# Patient Record
Sex: Male | Born: 1980 | Race: Black or African American | Hispanic: No | Marital: Single | Smoking: Never smoker
Health system: Southern US, Community
[De-identification: ages and names within clinical notes are randomized; demographics above are authoritative.]

---

## 2004-07-14 ENCOUNTER — Emergency Department (HOSPITAL_COMMUNITY): Admission: EM | Admit: 2004-07-14 | Discharge: 2004-07-14 | Payer: Self-pay | Admitting: *Deleted

## 2004-07-16 ENCOUNTER — Emergency Department (HOSPITAL_COMMUNITY): Admission: EM | Admit: 2004-07-16 | Discharge: 2004-07-16 | Payer: Self-pay | Admitting: Emergency Medicine

## 2007-04-15 ENCOUNTER — Emergency Department (HOSPITAL_COMMUNITY): Admission: EM | Admit: 2007-04-15 | Discharge: 2007-04-15 | Payer: Self-pay | Admitting: Emergency Medicine

## 2007-04-21 ENCOUNTER — Emergency Department (HOSPITAL_COMMUNITY): Admission: EM | Admit: 2007-04-21 | Discharge: 2007-04-21 | Payer: Self-pay | Admitting: Emergency Medicine

## 2007-12-27 ENCOUNTER — Emergency Department (HOSPITAL_COMMUNITY): Admission: EM | Admit: 2007-12-27 | Discharge: 2007-12-27 | Payer: Self-pay | Admitting: Family Medicine

## 2009-05-15 ENCOUNTER — Emergency Department (HOSPITAL_COMMUNITY): Admission: EM | Admit: 2009-05-15 | Discharge: 2009-05-15 | Payer: Self-pay | Admitting: Emergency Medicine

## 2009-08-04 ENCOUNTER — Emergency Department (HOSPITAL_COMMUNITY): Admission: EM | Admit: 2009-08-04 | Discharge: 2009-08-04 | Payer: Self-pay | Admitting: Family Medicine

## 2011-06-03 ENCOUNTER — Inpatient Hospital Stay (INDEPENDENT_AMBULATORY_CARE_PROVIDER_SITE_OTHER)
Admission: RE | Admit: 2011-06-03 | Discharge: 2011-06-03 | Disposition: A | Discharge status: Home / Self Care | Payer: BC Managed Care – PPO | Source: Ambulatory Visit | Attending: Family Medicine | Admitting: Family Medicine

## 2011-06-03 DIAGNOSIS — K5289 Other specified noninfective gastroenteritis and colitis: Secondary | ICD-10-CM

## 2011-06-03 DIAGNOSIS — J45909 Unspecified asthma, uncomplicated: Secondary | ICD-10-CM

## 2011-08-12 ENCOUNTER — Encounter (HOSPITAL_COMMUNITY): Payer: Self-pay | Admitting: Radiology

## 2011-08-12 ENCOUNTER — Emergency Department (HOSPITAL_COMMUNITY): Payer: BC Managed Care – PPO

## 2011-08-12 ENCOUNTER — Observation Stay (HOSPITAL_COMMUNITY)
Admission: EM | Admit: 2011-08-12 | Discharge: 2011-08-14 | DRG: 883 | Disposition: A | Discharge status: Home / Self Care | Payer: BC Managed Care – PPO | Attending: General Surgery | Admitting: General Surgery

## 2011-08-12 ENCOUNTER — Inpatient Hospital Stay (INDEPENDENT_AMBULATORY_CARE_PROVIDER_SITE_OTHER)
Admission: RE | Admit: 2011-08-12 | Discharge: 2011-08-12 | Disposition: A | Discharge status: Home / Self Care | Payer: BC Managed Care – PPO | Source: Ambulatory Visit | Attending: Family Medicine | Admitting: Family Medicine

## 2011-08-12 DIAGNOSIS — R1031 Right lower quadrant pain: Secondary | ICD-10-CM

## 2011-08-12 DIAGNOSIS — K3580 Unspecified acute appendicitis: Secondary | ICD-10-CM

## 2011-08-12 DIAGNOSIS — R109 Unspecified abdominal pain: Secondary | ICD-10-CM

## 2011-08-12 DIAGNOSIS — Z23 Encounter for immunization: Secondary | ICD-10-CM | POA: Insufficient documentation

## 2011-08-12 DIAGNOSIS — J45909 Unspecified asthma, uncomplicated: Secondary | ICD-10-CM | POA: Insufficient documentation

## 2011-08-12 HISTORY — PX: APPENDECTOMY: SHX54

## 2011-08-12 LAB — POCT I-STAT, CHEM 8
Creatinine, Ser: 1.2 mg/dL (ref 0.50–1.35)
Hemoglobin: 15.3 g/dL (ref 13.0–17.0)
Sodium: 139 mEq/L (ref 135–145)
TCO2: 27 mmol/L (ref 0–100)

## 2011-08-12 LAB — CBC
Hemoglobin: 12.8 g/dL — ABNORMAL LOW (ref 13.0–17.0)
MCHC: 34.4 g/dL (ref 30.0–36.0)
Platelets: 219 10*3/uL (ref 150–400)
RDW: 13.4 % (ref 11.5–15.5)

## 2011-08-12 LAB — COMPREHENSIVE METABOLIC PANEL
Albumin: 3.8 g/dL (ref 3.5–5.2)
BUN: 9 mg/dL (ref 6–23)
Creatinine, Ser: 0.97 mg/dL (ref 0.50–1.35)
GFR calc Af Amer: >60 mL/min (ref >60)
Total Protein: 7 g/dL (ref 6.0–8.3)

## 2011-08-12 LAB — DIFFERENTIAL
Basophils Absolute: 0 10*3/uL (ref 0.0–0.1)
Basophils Relative: 0 % (ref 0–1)
Eosinophils Absolute: 0 10*3/uL (ref 0.0–0.7)
Eosinophils Relative: 0 % (ref 0–5)
Monocytes Absolute: 1.2 10*3/uL — ABNORMAL HIGH (ref 0.1–1.0)
Neutro Abs: 8.9 10*3/uL — ABNORMAL HIGH (ref 1.7–7.7)

## 2011-08-12 LAB — URINALYSIS, ROUTINE W REFLEX MICROSCOPIC
Glucose, UA: NEGATIVE mg/dL
Leukocytes, UA: NEGATIVE
Nitrite: NEGATIVE
Protein, ur: NEGATIVE mg/dL
pH: 7.5 (ref 5.0–8.0)

## 2011-08-12 LAB — PROTIME-INR
INR: 1.02 (ref 0.00–1.49)
Prothrombin Time: 13.6 seconds (ref 11.6–15.2)

## 2011-08-12 MED ORDER — IOHEXOL 300 MG/ML  SOLN
100.0000 mL | Freq: Once | INTRAMUSCULAR | Status: AC | PRN
  Administered 2011-08-12: 100 mL via INTRAVENOUS

## 2011-08-12 NOTE — H&P (Signed)
  NAMEJAIDEN, Blake Holland NO.:  [id number]  MEDICAL RECORD NO.:  [id number]  LOCATION:  [facility]                         FACILITY:  [facility]  PHYSICIAN:  Juanetta Gosling, MDDATE OF BIRTH:  [birth year]  DATE OF ADMISSION:  08/12/2011 DATE OF DISCHARGE:                             HISTORY & PHYSICAL   CHIEF COMPLAINT:  Abdominal pain.  HISTORY OF PRESENT ILLNESS:  This is a 30 year old male who has about 2- 3-day history of lower abdominal pain that is worsened over that time and was not improved at all.  It was getting no relief and was aggravated by movement and palpation.  He has attempted multiple therapies including Maalox without any relief.  He had a subjective fever and had a couple of loose stools, but he has no nausea and vomiting associated with this.  He is anorectic.  He came in today just due to worsening of the pain.  PAST SURGICAL HISTORY:  Negative.  PAST MEDICAL HISTORY:  Reactive airway disease.  FAMILY HISTORY:  Noncontributory.  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  He drinks occasional alcohol. Does not smoke.  DRUG ALLERGIES:  IODINE/SHELLFISH.  MEDICATIONS:  Albuterol p.r.n. which he rarely uses.  REVIEW OF SYSTEMS:  He has had a dry cough for several months, but is otherwise negative.  PHYSICAL EXAMINATION:  VITAL SIGNS:  98.7, 83, 16, 140/88, 100%. GENERAL:  He is a well-appearing male, in no distress. HEENT:  No scleral icterus. NECK:  Supple without adenopathy. HEART:  Regular rate and rhythm. LUNGS:  Clear bilaterally. ABDOMEN:  Soft.  He has bowel sounds present.  He is tender in his right lower quadrant and suprapubic region.  LABS:  Show a potassium of 3.9, creatinine 0.97, glucose 70.  Liver function tests are within normal limits.  His white blood cell count is 12.7, hematocrit 37.2, platelets 219.  A CT scan is consistent with appendicitis.  ASSESSMENT:  Acute appendicitis.  PLAN:  I discussed laparoscopic  appendectomy with him with the risks being but not limited to bleeding, infection, open procedure, and postoperative abscess, and injury to other structures surrounding the appendix.  He understands.  He has been given some Zosyn.  We will plan on proceeding as soon as I can in the operating room.     Juanetta Gosling, MD     MCW/MEDQ  D:  08/12/2011  T:  08/12/2011  Job:  [id number]  Electronically Signed by Emelia Loron MD on 08/12/2011 09:20:35 PM

## 2011-08-14 NOTE — Op Note (Signed)
  NAMEDUSAN, LIPFORD NO.:  [id number]  MEDICAL RECORD NO.:  [id number]  LOCATION:  2550                         FACILITY:  [facility]  PHYSICIAN:  Juanetta Gosling, MDDATE OF BIRTH:  06/18/1981  DATE OF PROCEDURE: DATE OF DISCHARGE:                              OPERATIVE REPORT   PREOPERATIVE DIAGNOSIS:  Acute appendicitis.  POSTOPERATIVE DIAGNOSIS:  Acute suppurative appendicitis.  PROCEDURE:  Laparoscopic appendectomy.  SURGEON:  Troy Sine. Dwain Sarna, MD  ASSISTANT:  None.  ANESTHESIA:  General.  SUPERVISING ANESTHESIOLOGIST:  Maren Beach, MD  SPECIMENS:  Appendix to Pathology.  ESTIMATED BLOOD LOSS:  Minimal.  COMPLICATIONS:  None.  DRAINS:  None.  DISPOSITION:  To recovery room in stable condition.  INDICATIONS:  A 30 year old male with several days of lower abdominal pain mostly in his right lower quadrant with an elevated white count and a CT scan consistent with appendicitis.  I discussed appendectomy with him.  PROCEDURE IN DETAIL:  After informed consent was obtained, the patient was first given Zosyn in the emergency room.  He was then taken to the operating room.  He had sequential compression devices placed on lower extremities prior to induction with anesthesia.  He was then placed under general anesthesia without complication.  Foley catheter was placed.  He was then prepped and draped in standard sterile surgical fashion.  Surgical time-out was then performed.  We then infiltrated with 0.25% Marcaine below his umbilicus.  I made an incision with a 11 blade and carried this down to the level of fascia. I then incised this, I entered the peritoneum bluntly.  I placed a 0 Vicryl purse-string sutures of the fascia.  I introduced a Hasson trocar and insufflated the abdomen with 15 mmHg pressure.  I then placed a 5-mm trocar in the suprapubic region as well as the right upper quadrant under direct vision after infiltration of  local anesthetic without complication.  His appendix was noted be acutely suppurative and was almost necrotic in several places.  I was able to identify his base as well as his appendiceal mesentery.  I was able to encircle its base and I came across this with a GIA stapler.  I then divided the remainder of the mesentery with the harmonic scalpel.  There was hemostasis in the staple line and the cecum looked clean as well.  I placed the appendix in EndoCatch bag and removed this from the umbilicus.  I then irrigated and evacuated all of the irrigant.  I then desufflated the abdomen and removed all trocars.  I then closed down the umbilical incision with a purse-string suture and this completely obliterated the defect.  I then closed this with 4-0 Monocryl and Dermabond.  His Foley catheter was removed.  He was extubated and transferred to recovery room in stable condition.     Juanetta Gosling, MD     MCW/MEDQ  D:  08/12/2011  T:  08/12/2011  Job:  [id number]  Electronically Signed by Emelia Loron MD on 08/14/2011 02:07:01 PM

## 2011-08-15 ENCOUNTER — Other Ambulatory Visit (INDEPENDENT_AMBULATORY_CARE_PROVIDER_SITE_OTHER): Payer: Self-pay | Admitting: General Surgery

## 2011-08-26 LAB — POCT RAPID STREP A: Streptococcus, Group A Screen (Direct): NEGATIVE

## 2011-08-30 ENCOUNTER — Ambulatory Visit (INDEPENDENT_AMBULATORY_CARE_PROVIDER_SITE_OTHER): Payer: BC Managed Care – PPO | Admitting: Radiology

## 2011-08-30 ENCOUNTER — Encounter (INDEPENDENT_AMBULATORY_CARE_PROVIDER_SITE_OTHER): Payer: Self-pay | Admitting: Radiology

## 2011-08-30 VITALS — BP 116/68 | HR 60 | Temp 96.5°F | Resp 18 | Ht 76.0 in | Wt 244.0 lb

## 2011-08-30 DIAGNOSIS — K3580 Unspecified acute appendicitis: Secondary | ICD-10-CM

## 2011-08-30 NOTE — Progress Notes (Signed)
Blake Holland 1981-04-13 [id number] 08/30/2011   History of Present Illness: Blake Holland is a  30 y.o. male who presents today status post lap appy.  Pathology reveals Appendicitis, no cancer.  The patient is tolerating a regular diet, having normal bowel movements, has good pain control.  He  is back to most normal activities.   Physical Exam: Abd: soft, nontender, active bowel sounds, nondistended.  All incisions are well healed.  Impression: 1.  Acute appendicitis, s/p lap appy  Plan: He is able to return to normal activities. He may follow up on a prn basis.

## 2012-05-14 ENCOUNTER — Encounter (HOSPITAL_COMMUNITY): Payer: Self-pay

## 2012-05-14 ENCOUNTER — Emergency Department (HOSPITAL_COMMUNITY)
Admission: EM | Admit: 2012-05-14 | Discharge: 2012-05-14 | Disposition: A | Discharge status: Home / Self Care | Payer: Self-pay | Attending: Emergency Medicine | Admitting: Emergency Medicine

## 2012-05-14 ENCOUNTER — Emergency Department (HOSPITAL_COMMUNITY): Payer: Self-pay

## 2012-05-14 DIAGNOSIS — IMO0002 Reserved for concepts with insufficient information to code with codable children: Secondary | ICD-10-CM | POA: Insufficient documentation

## 2012-05-14 DIAGNOSIS — S61209A Unspecified open wound of unspecified finger without damage to nail, initial encounter: Secondary | ICD-10-CM | POA: Insufficient documentation

## 2012-05-14 DIAGNOSIS — S62639B Displaced fracture of distal phalanx of unspecified finger, initial encounter for open fracture: Secondary | ICD-10-CM | POA: Insufficient documentation

## 2012-05-14 MED ORDER — TETANUS-DIPHTH-ACELL PERTUSSIS 5-2.5-18.5 LF-MCG/0.5 IM SUSP
0.5000 mL | Freq: Once | INTRAMUSCULAR | Status: AC
  Administered 2012-05-14: 0.5 mL via INTRAMUSCULAR
  Filled 2012-05-14: qty 0.5

## 2012-05-14 MED ORDER — MORPHINE SULFATE 4 MG/ML IJ SOLN: 4.0000 mg | Freq: Once | INTRAMUSCULAR | Status: DC

## 2012-05-14 MED ORDER — CLINDAMYCIN HCL 300 MG PO CAPS: 300.0000 mg | ORAL_CAPSULE | Freq: Four times a day (QID) | ORAL | Status: AC

## 2012-05-14 MED ORDER — OXYCODONE-ACETAMINOPHEN 5-325 MG PO TABS
1.0000 | ORAL_TABLET | Freq: Once | ORAL | Status: AC
  Administered 2012-05-14: 1 via ORAL
  Filled 2012-05-14: qty 1

## 2012-05-14 MED ORDER — CLINDAMYCIN HCL 300 MG PO CAPS
300.0000 mg | ORAL_CAPSULE | Freq: Once | ORAL | Status: AC
  Administered 2012-05-14: 300 mg via ORAL
  Filled 2012-05-14: qty 1

## 2012-05-14 MED ORDER — PERCOCET 5-325 MG PO TABS: 1.0000 | ORAL_TABLET | ORAL | Status: AC | PRN

## 2012-05-14 MED ORDER — IBUPROFEN 800 MG PO TABS: 800.0000 mg | ORAL_TABLET | Freq: Three times a day (TID) | ORAL | Status: DC | PRN

## 2012-05-14 MED ORDER — IBUPROFEN 800 MG PO TABS
800.0000 mg | ORAL_TABLET | Freq: Once | ORAL | Status: AC
  Administered 2012-05-14: 800 mg via ORAL
  Filled 2012-05-14: qty 1

## 2012-05-14 NOTE — ED Provider Notes (Signed)
12:27 PM Patient in CDU holding.  Pt with open fracture of left index finger, laceration to palmar aspect.  Dr Karma Ganja has spoken with Dr Mina Marble who requests loose closure, f/u in his office tomorrow, d/c home on clindamycin.  I have discussed this plan with patient who verbalizes understanding and agrees with plan.  Would like more pain medication currently.    2:30 PM LACERATION REPAIR Performed by: Rise Patience Consent: Verbal consent obtained. Risks and benefits: risks, benefits and alternatives were discussed Patient identity confirmed: provided demographic data Time out performed prior to procedure Prepped and Draped in normal sterile fashion Wound explored  Laceration Location: left index finger, palmar aspect   Laceration Length: 4cm  No Foreign Bodies seen or palpated  Anesthesia: digital block local infiltration  Local anesthetic: lidocaine 2% no epinephrine  Anesthetic total: 6 ml  Irrigation method: syringe Amount of cleaning: standard  Skin closure: 5-0 prolene  Number of sutures or staples: 6  Technique: simple interrupted, loosely approximated.    Patient tolerance: Patient tolerated the procedure well with no immediate complications.   Patient with open fracture to left index finger.  Per Dr Ronie Spies request, I have thoroughly cleaned and closed the wound loosely.  Pt placed on clindamycin, first dose given in ED.  D/C home with follow up tomorrow with Dr Mina Marble in the office.  Rx for percocet and clindamycin.  Pt's wound dressed and placed in finger splint in ED.  Return precautions discussed and given in discharge instructions.  Patient verbalizes understanding and agrees with plan.    Dillard Cannon [place], [place] 05/14/12 [phone]

## 2012-05-14 NOTE — Progress Notes (Signed)
Orthopedic Tech Progress Note Patient Details:  Blake Holland 1981-05-22 [id number]  Ortho Devices Type of Ortho Device: Finger splint Ortho Device/Splint Location: (L) UE Ortho Device/Splint Interventions: Application   Jennye Moccasin 05/14/2012, 3:11 PM

## 2012-05-14 NOTE — Discharge Instructions (Signed)
Read the information below.  You have an open fracture of your finger.  It is very important that you take the antibiotics as directed until they are gone.  Take the pain medication as directed.  Do not take additional tylenol while taking this medication to avoid overdose.  You are to see Dr Mina Marble in his office tomorrow.  Please call his office today.  Return to the ER immediately if you develop uncontrolled pain, redness, swelling, difficulty bending or straightening your finger, pus draining from the wound, or fevers greater than 100.4.  You may return to the ER at any time for worsening condition or any new symptoms that concern you.  Finger Fracture Fractures of fingers are breaks in the bones of the fingers. There are many types of fractures. There are different ways of treating these fractures, all of which can be correct. Your caregiver will discuss the best way to treat your fracture. TREATMENT  Finger fractures can be treated with:   Non-reduction - this means the bones are in place. The finger is splinted without changing the positions of the bone pieces. The splint is usually left on for about a week to ten days. This will depend on your fracture and what your caregiver thinks.   Closed reduction - the bones are put back into position without using surgery. The finger is then splinted.   ORIF (open reduction and internal fixation) - the fracture site is opened. Then the bone pieces are fixed into place with pins or some type of hardware. This is seldom required. It depends on the severity of the fracture.  Your caregiver will discuss the type of fracture you have and the treatment that will be best for that problem. If surgery is the treatment of choice, the following is information for you to know and also let your caregiver know about prior to surgery. LET YOUR CAREGIVER KNOW ABOUT:  Allergies   Medications taken including herbs, eye drops, over the counter medications, and creams     Use of steroids (by mouth or creams)   Previous problems with anesthetics or Novocaine   Possibility of pregnancy, if this applies   History of blood clots (thrombophlebitis)   History of bleeding or blood problems   Previous surgery   Other health problems  AFTER THE PROCEDURE After surgery, you will be taken to the recovery area where a nurse will check your progress. Once you're awake, stable, and taking fluids well, barring other problems you will be allowed to go home. Once home an ice pack applied to your operative site may help with discomfort and keep the swelling down. HOME CARE INSTRUCTIONS   Follow your caregiver's instructions as to activities, exercises, physical therapy, and driving a car.   Use your finger and exercise as directed.   Only take over-the-counter or prescription medicines for pain, discomfort, or fever as directed by your caregiver. Do not take aspirin until your caregiver OK's it, as this can increase bleeding immediately following surgery.   Stop using ibuprofen if it upsets your stomach. Let your caregiver know about it.  SEEK MEDICAL CARE IF:  You have increased bleeding (more than a small spot) from the wound or from beneath your splint.   You develop redness, swelling, or increasing pain in the wound or from beneath your splint.   There is pus coming from the wound or from beneath your splint.   An unexplained oral temperature above 102 F (38.9 C) develops, or as your  caregiver suggests.   There is a foul smell coming from the wound or dressing or from beneath your splint.  SEEK IMMEDIATE MEDICAL CARE IF:   You develop a rash.   You have difficulty breathing.   You have any allergic problems.  MAKE SURE YOU:   Understand these instructions.   Will watch your condition.   Will get help right away if you are not doing well or get worse.  Document Released: 03/05/2001 Document Revised: 11/10/2011 Document Reviewed:  07/10/2008 [facility] Patient Information 2012 [place], [place].

## 2012-05-14 NOTE — ED Notes (Signed)
MD at bedside. 

## 2012-05-14 NOTE — ED Notes (Signed)
Patient transported to X-ray 

## 2012-05-14 NOTE — ED Notes (Signed)
Pt presented to the ER with laceration to lt index finger after his hand got caught with the front door.

## 2012-05-14 NOTE — ED Provider Notes (Signed)
History     CSN: [id number]  Arrival date & time 05/14/12  3086   First MD Initiated Contact with Patient 05/14/12 [phone]      Chief Complaint  Patient presents with  . Hand Injury    (Consider location/radiation/quality/duration/timing/severity/associated sxs/prior treatment) HPI Pt presents with c/o pain in left first finger after slamming it into a car door this morning.  Laceration to palmar surface of index finger.  He does not remember when he last had a tetanus shot.  No other injuries.  Bleeding controlled with direct pressure.  Movement and palpation makes pain worse.  There are no other alleviating or modifying factors, there are no associated systemic symptoms.   Past Medical History  Diagnosis Date  . Asthma   . Abdominal pain     around surgical area from appendectomy     Past Surgical History  Procedure Date  . Appendectomy 08/12/11    Family History  Problem Relation Age of Onset  . Cancer Mother 21    breast  . Heart disease Father 76    History  Substance Use Topics  . Smoking status: Never Smoker   . Smokeless tobacco: Never Used  . Alcohol Use: Yes      Review of Systems ROS reviewed and all otherwise negative except for mentioned in HPI  Allergies  Shellfish allergy  Home Medications   Current Outpatient Rx  Name Route Sig Dispense Refill  . FEXOFENADINE HCL 180 MG PO TABS Oral Take 180 mg by mouth daily.    Marland Kitchen CLINDAMYCIN HCL 300 MG PO CAPS Oral Take 1 capsule (300 mg total) by mouth 4 (four) times daily. 40 capsule 0  . IBUPROFEN 800 MG PO TABS Oral Take 1 tablet (800 mg total) by mouth every 8 (eight) hours as needed for pain. 21 tablet 0  . PERCOCET 5-325 MG PO TABS Oral Take 1-2 tablets by mouth every 4 (four) hours as needed for pain. 20 tablet 0    Dispense as written.    BP 141/83  Pulse 72  Temp(Src) 98.2 F (36.8 C) (Oral)  Resp 19  Ht 6\' 4"  (1.93 m)  Wt 250 lb (113.399 kg)  BMI 30.43 kg/m2  SpO2 100% Vitals  reviewed Physical Exam Physical Examination: General appearance - alert, well appearing, and in no distress Mental status - alert, oriented to person, place, and time Eyes - no conjunctival injection, no scleral icterus Chest - clear to auscultation, no wheezes, rales or rhonchi, symmetric air entry Heart - normal rate, regular rhythm, normal S1, S2, no murmurs, rubs, clicks or gallops Musculoskeletal - left first finger with laceration as described- difficulty flexing at DIP joint, full extension and flexion at other finger and hand joints, otherwise  no joint tenderness, deformity or swelling Extremities - peripheral pulses normal, no pedal edema, no clubbing or cyanosis Skin - normal coloration and turgor, no rashes, approx 3cm laceration to flexor surface of left first finger overlying DIP flexor crease, bleeding controlled  ED Course  Procedures (including critical care time)  10:24 AM discussed injury with Dr. Ronie Spies office, she will relay message to him as he is in surgery, pt NPO since last night  10:58 AM pt to be moved to CDU while awaiting hand consult.  D/w Trixie Dredge, PA  11:36 AM  Call back received from Dr. Mina Marble- he recommends loose closure and f/u with him tomorrow in his clinic.  Pt to be started on clindamycin as well.   Labs Reviewed -  No data to display Dg Finger Index Left  05/14/2012  *RADIOLOGY REPORT*  Clinical Data: Left finger injury with pain and laceration.  LEFT INDEX FINGER 2+V  Comparison: None.  Findings: There is a nondisplaced obliquely oriented fracture involving the distal phalanx.  The fracture line extends to the distal interphalangeal joint.  Overlying soft tissue swelling and laceration.  IMPRESSION: Nondisplaced distal phalangeal fracture.  Original Report Authenticated By: Reyes Ivan, M.D.     1. Open fracture of finger, distal phalanx       MDM  Pt presenting with c/o injury to left index finger after slamming it in a car door  this morning.  Xray shows fracture of distal phalanx- xray images reviewed by me as well.  Pt with minimal ability to flex at DIP joint, brisk cap refill distally.  D/w Dr. Mina Marble, hand surgery- he recommends loose closure- performed as noted by PA in CDU- pt started on clindamycin and instructed to f/u with Dr. Mina Marble tomorrow in his office.  Pt given strict return precuatinos, he is agreeable with this plan        Ethelda Chick, MD 05/16/12 1011

## 2012-05-16 ENCOUNTER — Ambulatory Visit: Payer: Self-pay | Attending: Orthopedic Surgery | Admitting: *Deleted

## 2012-05-16 DIAGNOSIS — M25549 Pain in joints of unspecified hand: Secondary | ICD-10-CM | POA: Insufficient documentation

## 2012-05-16 DIAGNOSIS — IMO0001 Reserved for inherently not codable concepts without codable children: Secondary | ICD-10-CM | POA: Insufficient documentation

## 2012-05-16 NOTE — ED Provider Notes (Signed)
Medical screening examination/treatment/procedure(s) were conducted as a shared visit with non-physician practitioner(s) and myself.  I personally evaluated the patient during the encounter  Ethelda Chick, MD 05/16/12 [phone]

## 2012-05-24 ENCOUNTER — Encounter (HOSPITAL_COMMUNITY): Payer: Self-pay | Admitting: Pharmacy Technician

## 2012-05-25 ENCOUNTER — Encounter (HOSPITAL_COMMUNITY): Payer: Self-pay | Admitting: *Deleted

## 2012-05-25 ENCOUNTER — Other Ambulatory Visit: Payer: Self-pay | Admitting: Orthopedic Surgery

## 2012-05-27 MED ORDER — CEFAZOLIN SODIUM-DEXTROSE 2-3 GM-% IV SOLR
2.0000 g | INTRAVENOUS | Status: AC
  Administered 2012-05-28: 2 g via INTRAVENOUS
  Filled 2012-05-27: qty 50

## 2012-05-27 MED ORDER — CHLORHEXIDINE GLUCONATE 4 % EX LIQD: 60.0000 mL | Freq: Once | CUTANEOUS | Status: DC

## 2012-05-28 ENCOUNTER — Encounter (HOSPITAL_COMMUNITY): Payer: Self-pay | Admitting: Emergency Medicine

## 2012-05-28 ENCOUNTER — Encounter (HOSPITAL_COMMUNITY)
Admission: RE | Disposition: A | Discharge status: Home / Self Care | Payer: Self-pay | Source: Ambulatory Visit | Attending: Orthopedic Surgery

## 2012-05-28 ENCOUNTER — Encounter (HOSPITAL_COMMUNITY): Payer: Self-pay | Admitting: *Deleted

## 2012-05-28 ENCOUNTER — Ambulatory Visit (HOSPITAL_COMMUNITY): Payer: Self-pay | Admitting: Anesthesiology

## 2012-05-28 ENCOUNTER — Emergency Department (HOSPITAL_COMMUNITY)
Admission: EM | Admit: 2012-05-28 | Discharge: 2012-05-29 | Disposition: A | Discharge status: Home / Self Care | Payer: Self-pay | Attending: Emergency Medicine | Admitting: Emergency Medicine

## 2012-05-28 ENCOUNTER — Encounter (HOSPITAL_COMMUNITY): Payer: Self-pay | Admitting: Anesthesiology

## 2012-05-28 ENCOUNTER — Ambulatory Visit (HOSPITAL_COMMUNITY)
Admission: RE | Admit: 2012-05-28 | Discharge: 2012-05-28 | Disposition: A | Discharge status: Home / Self Care | Payer: Self-pay | Source: Ambulatory Visit | Attending: Orthopedic Surgery | Admitting: Orthopedic Surgery

## 2012-05-28 DIAGNOSIS — S62609A Fracture of unspecified phalanx of unspecified finger, initial encounter for closed fracture: Secondary | ICD-10-CM

## 2012-05-28 DIAGNOSIS — Z803 Family history of malignant neoplasm of breast: Secondary | ICD-10-CM | POA: Insufficient documentation

## 2012-05-28 DIAGNOSIS — Z91013 Allergy to seafood: Secondary | ICD-10-CM | POA: Insufficient documentation

## 2012-05-28 DIAGNOSIS — S62639A Displaced fracture of distal phalanx of unspecified finger, initial encounter for closed fracture: Secondary | ICD-10-CM | POA: Insufficient documentation

## 2012-05-28 DIAGNOSIS — M79609 Pain in unspecified limb: Secondary | ICD-10-CM | POA: Insufficient documentation

## 2012-05-28 DIAGNOSIS — J45909 Unspecified asthma, uncomplicated: Secondary | ICD-10-CM | POA: Insufficient documentation

## 2012-05-28 DIAGNOSIS — G8918 Other acute postprocedural pain: Secondary | ICD-10-CM | POA: Insufficient documentation

## 2012-05-28 DIAGNOSIS — M79646 Pain in unspecified finger(s): Secondary | ICD-10-CM

## 2012-05-28 DIAGNOSIS — X58XXXA Exposure to other specified factors, initial encounter: Secondary | ICD-10-CM | POA: Insufficient documentation

## 2012-05-28 DIAGNOSIS — Z8249 Family history of ischemic heart disease and other diseases of the circulatory system: Secondary | ICD-10-CM | POA: Insufficient documentation

## 2012-05-28 HISTORY — PX: I&D EXTREMITY: SHX5045

## 2012-05-28 LAB — CBC
Platelets: 242 10*3/uL (ref 150–400)
RBC: 4.36 MIL/uL (ref 4.22–5.81)
RDW: 13.2 % (ref 11.5–15.5)
WBC: 4.9 10*3/uL (ref 4.0–10.5)

## 2012-05-28 LAB — SURGICAL PCR SCREEN: MRSA, PCR: NEGATIVE

## 2012-05-28 SURGERY — IRRIGATION AND DEBRIDEMENT EXTREMITY
Anesthesia: General | Site: Finger | Laterality: Left | Wound class: Clean

## 2012-05-28 MED ORDER — SODIUM CHLORIDE 0.9 % IR SOLN
Status: DC | PRN
  Administered 2012-05-28: 1

## 2012-05-28 MED ORDER — MIDAZOLAM HCL 5 MG/5ML IJ SOLN
INTRAMUSCULAR | Status: DC | PRN
  Administered 2012-05-28: 2 mg via INTRAVENOUS

## 2012-05-28 MED ORDER — FENTANYL CITRATE 0.05 MG/ML IJ SOLN
INTRAMUSCULAR | Status: DC | PRN
  Administered 2012-05-28: 100 ug via INTRAVENOUS
  Administered 2012-05-28: 50 ug via INTRAVENOUS

## 2012-05-28 MED ORDER — HYDROMORPHONE HCL PF 1 MG/ML IJ SOLN
0.2500 mg | INTRAMUSCULAR | Status: DC | PRN
  Administered 2012-05-28 (×4): 0.5 mg via INTRAVENOUS

## 2012-05-28 MED ORDER — ONDANSETRON HCL 4 MG/2ML IJ SOLN
INTRAMUSCULAR | Status: DC | PRN
  Administered 2012-05-28: 4 mg via INTRAVENOUS

## 2012-05-28 MED ORDER — BUPIVACAINE HCL (PF) 0.25 % IJ SOLN
INTRAMUSCULAR | Status: AC
  Filled 2012-05-28: qty 30

## 2012-05-28 MED ORDER — BUPIVACAINE HCL (PF) 0.25 % IJ SOLN
INTRAMUSCULAR | Status: DC | PRN
  Administered 2012-05-28: 4 mL

## 2012-05-28 MED ORDER — DROPERIDOL 2.5 MG/ML IJ SOLN: 0.6250 mg | INTRAMUSCULAR | Status: DC | PRN

## 2012-05-28 MED ORDER — MUPIROCIN 2 % EX OINT
TOPICAL_OINTMENT | CUTANEOUS | Status: AC
  Administered 2012-05-28: 1 via NASAL
  Filled 2012-05-28: qty 22

## 2012-05-28 MED ORDER — LIDOCAINE HCL (CARDIAC) 20 MG/ML IV SOLN
INTRAVENOUS | Status: DC | PRN
  Administered 2012-05-28: 50 mg via INTRAVENOUS
  Administered 2012-05-28: 100 mg via INTRAVENOUS

## 2012-05-28 MED ORDER — LACTATED RINGERS IV SOLN
INTRAVENOUS | Status: DC | PRN
  Administered 2012-05-28 (×2): via INTRAVENOUS

## 2012-05-28 MED ORDER — OXYCODONE-ACETAMINOPHEN 5-325 MG PO TABS: 1.0000 | ORAL_TABLET | ORAL | Status: DC | PRN

## 2012-05-28 MED ORDER — PROPOFOL 10 MG/ML IV EMUL
INTRAVENOUS | Status: DC | PRN
  Administered 2012-05-28: 200 mg via INTRAVENOUS

## 2012-05-28 MED ORDER — CEFAZOLIN SODIUM 1-5 GM-% IV SOLN
INTRAVENOUS | Status: AC
  Filled 2012-05-28: qty 100

## 2012-05-28 MED ORDER — HYDROMORPHONE HCL PF 1 MG/ML IJ SOLN
INTRAMUSCULAR | Status: AC
  Filled 2012-05-28: qty 1

## 2012-05-28 MED ORDER — LACTATED RINGERS IV SOLN
INTRAVENOUS | Status: DC
  Administered 2012-05-28: 15:00:00 via INTRAVENOUS

## 2012-05-28 SURGICAL SUPPLY — 55 items
BANDAGE CONFORM 2  STR LF (GAUZE/BANDAGES/DRESSINGS) IMPLANT
BANDAGE ELASTIC 3 VELCRO ST LF (GAUZE/BANDAGES/DRESSINGS) IMPLANT
BANDAGE ELASTIC 4 VELCRO ST LF (GAUZE/BANDAGES/DRESSINGS) IMPLANT
BANDAGE GAUZE ELAST BULKY 4 IN (GAUZE/BANDAGES/DRESSINGS) IMPLANT
BNDG CMPR 9X4 STRL LF SNTH (GAUZE/BANDAGES/DRESSINGS)
BNDG COHESIVE 1X5 TAN STRL LF (GAUZE/BANDAGES/DRESSINGS) ×2 IMPLANT
BNDG ESMARK 4X9 LF (GAUZE/BANDAGES/DRESSINGS) IMPLANT
CLOTH BEACON ORANGE TIMEOUT ST (SAFETY) ×2 IMPLANT
CORDS BIPOLAR (ELECTRODE) IMPLANT
COVER SURGICAL LIGHT HANDLE (MISCELLANEOUS) ×4 IMPLANT
CUFF TOURNIQUET SINGLE 18IN (TOURNIQUET CUFF) ×2 IMPLANT
DECANTER SPIKE VIAL GLASS SM (MISCELLANEOUS) IMPLANT
DRAIN PENROSE 1/4X12 LTX STRL (WOUND CARE) IMPLANT
DRAPE OEC MINIVIEW 54X84 (DRAPES) IMPLANT
DRAPE SURG 17X23 STRL (DRAPES) ×2 IMPLANT
DRSG PAD ABDOMINAL 8X10 ST (GAUZE/BANDAGES/DRESSINGS) IMPLANT
DURAPREP 26ML APPLICATOR (WOUND CARE) IMPLANT
ELECT REM PT RETURN 9FT ADLT (ELECTROSURGICAL)
ELECTRODE REM PT RTRN 9FT ADLT (ELECTROSURGICAL) IMPLANT
GAUZE PACKING IODOFORM 1/4X5 (PACKING) IMPLANT
GAUZE XEROFORM 1X8 LF (GAUZE/BANDAGES/DRESSINGS) ×2 IMPLANT
GLOVE BIO SURGEON STRL SZ8.5 (GLOVE) ×2 IMPLANT
GLOVE BIOGEL PI IND STRL 6.5 (GLOVE) ×1 IMPLANT
GLOVE BIOGEL PI INDICATOR 6.5 (GLOVE) ×1
GLOVE SURG SS PI 6.5 STRL IVOR (GLOVE) ×2 IMPLANT
GOWN SRG XL XLNG 56XLVL 4 (GOWN DISPOSABLE) ×1 IMPLANT
GOWN STRL NON-REIN LRG LVL3 (GOWN DISPOSABLE) ×2 IMPLANT
GOWN STRL NON-REIN XL XLG LVL4 (GOWN DISPOSABLE) ×2
HANDPIECE INTERPULSE COAX TIP (DISPOSABLE)
KIT BASIN OR (CUSTOM PROCEDURE TRAY) ×2 IMPLANT
KIT ROOM TURNOVER OR (KITS) ×2 IMPLANT
MANIFOLD NEPTUNE II (INSTRUMENTS) IMPLANT
MICRO-AIRE KWIRE ×2 IMPLANT
NEEDLE HYPO 25GX1X1/2 BEV (NEEDLE) IMPLANT
NEEDLE HYPO 25X1 1.5 SAFETY (NEEDLE) ×2 IMPLANT
NS IRRIG 1000ML POUR BTL (IV SOLUTION) ×2 IMPLANT
PACK ORTHO EXTREMITY (CUSTOM PROCEDURE TRAY) ×2 IMPLANT
PAD ARMBOARD 7.5X6 YLW CONV (MISCELLANEOUS) ×4 IMPLANT
PAD CAST 4YDX4 CTTN HI CHSV (CAST SUPPLIES) IMPLANT
PADDING CAST COTTON 4X4 STRL (CAST SUPPLIES)
SET HNDPC FAN SPRY TIP SCT (DISPOSABLE) IMPLANT
SPLINT FINGER (SOFTGOODS) ×2 IMPLANT
SPONGE GAUZE 4X4 12PLY (GAUZE/BANDAGES/DRESSINGS) ×2 IMPLANT
SPONGE LAP 18X18 X RAY DECT (DISPOSABLE) IMPLANT
SUCTION FRAZIER TIP 10 FR DISP (SUCTIONS) IMPLANT
SUT VICRYL RAPIDE 4/0 PS 2 (SUTURE) ×2 IMPLANT
SYR 20CC LL (SYRINGE) IMPLANT
SYR CONTROL 10ML LL (SYRINGE) ×4 IMPLANT
TOWEL OR 17X24 6PK STRL BLUE (TOWEL DISPOSABLE) ×2 IMPLANT
TOWEL OR 17X26 10 PK STRL BLUE (TOWEL DISPOSABLE) ×2 IMPLANT
TUBE ANAEROBIC SPECIMEN COL (MISCELLANEOUS) IMPLANT
TUBE CONNECTING 12X1/4 (SUCTIONS) ×2 IMPLANT
UNDERPAD 30X30 INCONTINENT (UNDERPADS AND DIAPERS) ×2 IMPLANT
WATER STERILE IRR 1000ML POUR (IV SOLUTION) IMPLANT
YANKAUER SUCT BULB TIP NO VENT (SUCTIONS) ×2 IMPLANT

## 2012-05-28 NOTE — Anesthesia Preprocedure Evaluation (Signed)
Anesthesia Evaluation  Patient identified by MRN, date of birth, ID band Patient awake    Reviewed: Allergy & Precautions, H&P , NPO status , Patient's Chart, lab work & pertinent test results, reviewed documented beta blocker date and time   Airway Mallampati: II TM Distance: >3 FB Neck ROM: full    Dental  (+) Teeth Intact and Dental Advidsory Given   Pulmonary asthma ,  breath sounds clear to auscultation  Pulmonary exam normal       Cardiovascular negative cardio ROS  Rhythm:regular Rate:Normal     Neuro/Psych    GI/Hepatic negative GI ROS, Neg liver ROS,   Endo/Other    Renal/GU      Musculoskeletal   Abdominal Normal abdominal exam  (+)   Peds  Hematology negative hematology ROS (+)   Anesthesia Other Findings   Reproductive/Obstetrics negative OB ROS                           Anesthesia Physical Anesthesia Plan  ASA: II  Anesthesia Plan: General LMA   Post-op Pain Management:    Induction:   Airway Management Planned:   Additional Equipment:   Intra-op Plan:   Post-operative Plan:   Informed Consent: I have reviewed the patients History and Physical, chart, labs and discussed the procedure including the risks, benefits and alternatives for the proposed anesthesia with the patient or authorized representative who has indicated his/her understanding and acceptance.   Dental Advisory Given  Plan Discussed with: Anesthesiologist, CRNA and Surgeon  Anesthesia Plan Comments:         Anesthesia Quick Evaluation

## 2012-05-28 NOTE — Preoperative (Signed)
Beta Blockers   Reason not to administer Beta Blockers:Not Applicable 

## 2012-05-28 NOTE — Op Note (Signed)
See note [id number]

## 2012-05-28 NOTE — H&P (Signed)
Blake Holland is an 31 y.o. male.   Chief Complaint: left index fracture and laceration HPI: as above with decreases flexion and sensation distal to laceration  Past Medical History  Diagnosis Date  . Asthma     Past Surgical History  Procedure Date  . Appendectomy 08/12/11    Family History  Problem Relation Age of Onset  . Cancer Mother 70    breast  . Heart disease Father 34   Social History:  reports that he has never smoked. He has never used smokeless tobacco. He reports that he drinks alcohol. He reports that he does not use illicit drugs.  Allergies:  Allergies  Allergen Reactions  . Shellfish Allergy Hives and Shortness Of Breath  . Iodine Hives    Medications Prior to Admission  Medication Sig Dispense Refill  . albuterol (PROVENTIL HFA;VENTOLIN HFA) 108 (90 BASE) MCG/ACT inhaler Inhale 2 puffs into the lungs every 6 (six) hours as needed.      . clindamycin (CLEOCIN) 300 MG capsule Take 300 mg by mouth 4 (four) times daily.      . fexofenadine (ALLEGRA) 180 MG tablet Take 180 mg by mouth daily.        No results found for this or any previous visit (from the past 48 hour(s)). No results found.  Review of Systems  All other systems reviewed and are negative.    Blood pressure 135/78, pulse 76, temperature 98.5 F (36.9 C), temperature source Oral, resp. rate 20, SpO2 99.00%. Physical Exam  Constitutional: He is oriented to person, place, and time. He appears well-developed and well-nourished.  HENT:  Head: Normocephalic and atraumatic.  Cardiovascular: Normal rate.   Respiratory: Effort normal.  Musculoskeletal:       Left hand: He exhibits decreased range of motion, disruption of two-point discrimination and laceration.  Neurological: He is alert and oriented to person, place, and time.  Skin: Skin is warm.  Psychiatric: He has a normal mood and affect. His speech is normal and behavior is normal. Thought content normal.     Assessment/Plan As  above  Plan explore and repair as needed Blake Holland A 05/28/2012, 1:56 PM

## 2012-05-28 NOTE — Anesthesia Postprocedure Evaluation (Signed)
Anesthesia Post Note  Patient: Blake Holland  Procedure(s) Performed: Procedure(s) (LRB): IRRIGATION AND DEBRIDEMENT EXTREMITY (Left)  Anesthesia type: general  Patient location: PACU  Post pain: Pain level controlled  Post assessment: Patient's Cardiovascular Status Stable  Last Vitals:  Filed Vitals:   05/28/12 1545  BP: 145/108  Pulse: 75  Temp: 35.8 C  Resp: 14    Post vital signs: Reviewed and stable  Level of consciousness: sedated  Complications: No apparent anesthesia complications

## 2012-05-28 NOTE — ED Notes (Signed)
Pt reports having hand surgery today for fx bone and tendon repair by Dr Earl Gala today. Pt reports pain medication percocets ineffective relief of pain. Pt did not call doctor for additional instructions or medication but came to ED

## 2012-05-28 NOTE — Brief Op Note (Signed)
05/28/2012  3:42 PM  PATIENT:  Blake Holland  31 y.o. male  PRE-OPERATIVE DIAGNOSIS:  left index laceration   POST-OPERATIVE DIAGNOSIS:  Left index finger laceration and fracture  PROCEDURE:  Procedure(s) (LRB): IRRIGATION AND DEBRIDEMENT EXTREMITY (Left)  SURGEON:  Surgeon(s) and Role:    * Marlowe Shores, MD - Primary  PHYSICIAN ASSISTANT:   ASSISTANTS: none   ANESTHESIA:   general  EBL:  Total I/O In: 1000 [I.V.:1000] Out: -   BLOOD ADMINISTERED:none  DRAINS: none   LOCAL MEDICATIONS USED:  MARCAINE   4cc  SPECIMEN:  No Specimen  DISPOSITION OF SPECIMEN:  N/A  COUNTS:  YES  TOURNIQUET:   Total Tourniquet Time Documented: Upper Arm (Left) - 33 minutes  DICTATION: .Other Dictation: Dictation Number [id number]  PLAN OF CARE: Discharge to home after PACU  PATIENT DISPOSITION:  PACU - hemodynamically stable.   Delay start of Pharmacological VTE agent (>24hrs) due to surgical blood loss or risk of bleeding: not applicable

## 2012-05-28 NOTE — Transfer of Care (Signed)
Immediate Anesthesia Transfer of Care Note  Patient: Blake Holland  Procedure(s) Performed: Procedure(s) (LRB): IRRIGATION AND DEBRIDEMENT EXTREMITY (Left)  Patient Location: PACU  Anesthesia Type: General  Level of Consciousness: awake, alert , oriented and sedated  Airway & Oxygen Therapy: Patient Spontanous Breathing and Patient connected to nasal cannula oxygen  Post-op Assessment: Report given to PACU RN, Post -op Vital signs reviewed and stable and Patient moving all extremities  Post vital signs: Reviewed and stable  Complications: No apparent anesthesia complications

## 2012-05-29 NOTE — ED Notes (Signed)
PT had surgery yesterday to repair damaged tendons.  He has taken 3 percocets at once with no relief.  Radial/ulnar pulses present.  No edema beyond normal post-surgical edema.

## 2012-05-29 NOTE — ED Provider Notes (Signed)
History     CSN: [id number]  Arrival date & time 05/28/12  2054   First MD Initiated Contact with Patient 05/28/12 2343      Chief Complaint  Patient presents with  . Hand Pain    post-op   HPI  History provided by the patient. Patient is a 31 year old male with history of asthma and recent left finger surgery procedure who presents with complaints of finger pain. Patient was seen earlier today by Dr. Earl Gala have tendon and finger repair. Patient was sent home and has been taking Percocet for his pain but states it is not improved and complaints of uncontrolled throbbing pain to the finger. He denies having any bleeding through his bandage. He denies any numbness or tingling. Patient denies any new injury. Patient denies any fever, chills, sweats, nausea vomiting.    Past Medical History  Diagnosis Date  . Asthma     Past Surgical History  Procedure Date  . Appendectomy 08/12/11    Family History  Problem Relation Age of Onset  . Cancer Mother 36    breast  . Heart disease Father 55    History  Substance Use Topics  . Smoking status: Never Smoker   . Smokeless tobacco: Never Used  . Alcohol Use: Yes      Review of Systems  Constitutional: Negative for fever and chills.  Musculoskeletal:       Finger pain  Neurological: Negative for weakness and numbness.    Allergies  Shellfish allergy and Iodine  Home Medications   Current Outpatient Rx  Name Route Sig Dispense Refill  . ALBUTEROL SULFATE HFA 108 (90 BASE) MCG/ACT IN AERS Inhalation Inhale 2 puffs into the lungs every 6 (six) hours as needed.    Marland Kitchen CLINDAMYCIN HCL 300 MG PO CAPS Oral Take 300 mg by mouth 4 (four) times daily.    Marland Kitchen FEXOFENADINE HCL 180 MG PO TABS Oral Take 180 mg by mouth daily.    . OXYCODONE-ACETAMINOPHEN 5-325 MG PO TABS Oral Take 1 tablet by mouth every 4 (four) hours as needed. For pain      BP 120/84  Pulse 55  Temp 97.2 F (36.2 C) (Oral)  Resp 19  SpO2 98%  Physical Exam    Nursing note and vitals reviewed. Constitutional: He is oriented to person, place, and time. He appears well-developed and well-nourished. No distress.  HENT:  Head: Normocephalic.  Cardiovascular: Normal rate and regular rhythm.   Pulmonary/Chest: Effort normal and breath sounds normal.  Musculoskeletal:       No bleeding or drainage through the dressing. Surgical pins in place to the distal finger without bleeding. No significant swelling to the finger. No other concerning signs post surgery.  Neurological: He is alert and oriented to person, place, and time.  Skin: Skin is warm.  Psychiatric: He has a normal mood and affect.    ED Course  Procedures     1. Postoperative pain   2. Finger pain       MDM  Patient seen and evaluated. Patient no acute distress.   Digital block performed to left index finger. Pain improved greatly. Patient afebrile. No concerning findings on exam post procedure. Patient was home pain medications and advised to use rest, ice, elevation to help with symptoms.       Angus Seller, [place] 05/30/12 [phone]

## 2012-05-29 NOTE — ED Notes (Signed)
Pt denies pain at this time.  Lidocaine improved pain greatly.

## 2012-05-29 NOTE — Op Note (Signed)
NAMETREYLEN, GIBBS NO.:  [id number]  MEDICAL RECORD NO.:  [id number]  LOCATION:                                 FACILITY:  PHYSICIAN:  Artist Pais. Rohen Kimes, M.D.DATE OF BIRTH:  Mar 31, 1981  DATE OF PROCEDURE:  05/28/2012 DATE OF DISCHARGE:                              OPERATIVE REPORT   PREOPERATIVE DIAGNOSIS:  Displaced intra-articular fracture, distal phalanx, left index finger.  POSTOPERATIVE DIAGNOSIS:  Displaced intra-articular fracture, distal phalanx, left index finger.  PROCEDURE:  Incision and drainage above with open reduction internal fixation, use 2028 K-wires as well as inspection of flexor sheath, neurovascular bundles left index finger.  SURGEON:  Artist Pais. Mina Marble, M.D.  ASSISTANT:  None.  ANESTHESIA:  General.  TOURNIQUET TIME:  36 minutes.  COMPLICATIONS:  No complications.  DRAINS:  No drains.  DESCRIPTION OF PROCEDURE:  The patient was taken to the operating suite. After induction of adequate general anesthesia, left upper extremity was prepped and draped in usual sterile fashion.  An Esmarch was used to exsanguinate the limb.  Tourniquet was inflated to 250 mmHg.  At this point in time, previously placed sutures in the emergency room were removed.  The volar based flap skin was elevated and dissection was carried down to the flexor sheath.  The ulnar neurovascular bundle was intact.  Radial neurovascular bundle was intact.  The flexor insertion was intact.  There was a fracture of the distal phalanx, intra-articular at the base.  The wound was thoroughly irrigated, debrided and then loosely closed with 4-0 Vicryl Rapide.  Fluoroscopy was used to fire 028 K-wires from radial to ulnar across the fracture site parallel and perpendicular to the fracture site.  They were cut outside the skin and bent upon themselves.  The patient then had a sterile dressing of Xeroform, 4x4s, fluffs, and a volar splint applied.  The  patient tolerated the procedure well and went to the recovery room in a stable fashion.    Artist Pais Mina Marble, M.D.    MAW/MEDQ  D:  05/28/2012  T:  05/28/2012  Job:  [id number]

## 2012-05-29 NOTE — Discharge Instructions (Signed)
Continue your pain medications as prescribed. Use rest, ice, compression and elevation to help reduce pain and swelling. Please followup with Dr. Mina Marble tomorrow for continued evaluation and treatment.    RESOURCE GUIDE  Chronic Pain Problems: Contact [facility]  [phone] Patients need to be referred by their primary care doctor.  Insufficient Money for Medicine: Contact United Way:  call "211" or [facility] [phone].  No Primary Care Doctor: - Call [facility]  [phone] - can help you locate a primary care doctor that  accepts your insurance, provides certain services, etc. - Physician Referral Service- [phone]  Agencies that provide inexpensive medical care: - [facility]  [phone] - [facility]  [phone] - [facility]  [phone] - Women's Clinic  [phone] - Planned Parenthood  [phone] [facility]  [phone]  Medicaid-accepting [facility] Providers: - [facility]- [address]  [phone], Mon-Fri 9am-7pm, Sat 9am-1pm - [facility]- [address] [place], Suite [place]  [phone] - [facility]- [address], [address] [place]  [phone] [facility]- [address]  [phone] - Renaye Rakers- [address] [place], [address], [phone]  Only accepts [place] [facility] [place] patients after they have their name  applied to their card  Self Pay (no insurance) in [place]: - Sickle Cell Patients: Dr Willey Blade, [facility]  [address] [place], [phone] - [facility]- [address] [place]  [phone]       [facility]- [address]       -     [facility]- see information above (Speak to [employer] if you do not have insurance)       -  [facility]- [address] [place], [phone]       -  [facility]- [address],  [phone]       -  [facility]- [address], [phone]       -  Dr Julio Sicks-  [address], [place], [phone]       -  [facility]- [address], [phone]       -  [facility]- [address] [phone], also [address] [phone]       -    [facility]- [address] [place], [phone], 1st & 3rd Saturday   every month, 10am-1pm  1) Find a Doctor and Pay Out of Pocket Although you won't have to find out who is covered by your insurance plan, it is a good idea to ask around and get recommendations. You will then need to call the office and see if the doctor you have chosen will accept you as a new patient and what types of options they offer for patients who are self-pay. Some doctors offer discounts or will set up payment plans for their patients who do not have insurance, but you will need to ask so you aren't surprised when you get to your appointment.  2) Contact Your Local Health Department Not all health departments have doctors that can see patients for sick visits, but many do, so it is worth a call to see if yours does. If you don't know where  your local health department is, you can check in your phone book. The CDC also has a tool to help you locate your state's health department, and many state websites also have listings of all of their local health departments.  3) Find a Walk-in Clinic If your illness is not likely to be very severe or complicated, you may want to try a walk in clinic. These are popping up all over the country in pharmacies, drugstores, and shopping centers. They're usually staffed by nurse practitioners or physician assistants that have been trained to treat common illnesses and complaints. They're usually fairly quick and inexpensive. However, if you have serious medical issues or chronic medical problems, these are probably not your best option  STD  Testing - [facility] Department of [facility], STD Clinic, [address], [place], phone [phone] or [phone].  Monday - Friday, call for an appointment. [facility] Department of [employer], STD Clinic, [place] [address], [place], phone [phone] or [phone].  Monday - Friday, call for an appointment.  Abuse/Neglect: [facility] Child Abuse Hotline (217)728-2969 [facility] Child Abuse Hotline 534-126-5871 (After Hours)  Emergency Shelter:  [facility] 731-169-6240  Maternity Homes: - [facility] [phone] - [facility] [phone]  MRSA Hotline #:   952 094 8310  [facility] Resources  [facility]  United Way [facility] Dept. [address]                 [address]         [address] [place] [address]  Blondell Reveal Phone:  [phone]                                  Phone:  [phone]                   Phone:  [phone]  [facility] Mental Health, [phone] - [phone]       -     [facility] in [place], [address]                                  [phone], [facility] Child Abuse Hotline 864-533-3222 or 647-104-7274 (After Hours)   Behavioral Health Services  Substance Abuse Resources: - Alcohol and Drug Services  218-790-9536 - [facility] [phone] - [facility] [place] [phone] Floydene Flock [phone] - Residential & Outpatient Substance Abuse Program  9707928459  Psychological Services: [facility]  ([phone] Services  [phone] - [facility], [phone] [place]. [address], [place], ACCESS LINE: [phone] or ([phone],  [employer]an.[employer].za  Dental Assistance  If unable to pay or uninsured, contact:  [facility] or [facility]  Health Dept. to become qualified for the adult dental clinic.  Patients with Medicaid: [facility] [phone] [address], [phone], [phone]  If unable to pay, or uninsured, contact [facility] (719)856-8715) or [facility] Department [phone] in [place], [phone] in [facility]) to become qualified for the adult dental clinic  Other Low-Cost Community Dental Services: - [facility]- [address] [place], [place], [place], [address], [phone], Ext. [phone], 2nd and 4th Thursday of the month at 6:30am.  10 clients each day by appointment, can sometimes see walk-in patients if someone does not show for an appointment. [facility]- [address] [place], [place], [address], [phone] - [facility]- [address] [place], [place], [address], [phone] - [facility]- [phone] [facility] Health Department- [phone] [facility] Department- [phone]

## 2012-05-30 NOTE — ED Provider Notes (Signed)
Medical screening examination/treatment/procedure(s) were performed by non-physician practitioner and as supervising physician I was immediately available for consultation/collaboration.  Louellen Haldeman M Harjas Biggins, MD 05/30/12 0712 

## 2012-05-31 ENCOUNTER — Ambulatory Visit: Payer: Self-pay | Admitting: Occupational Therapy

## 2012-05-31 ENCOUNTER — Encounter (HOSPITAL_COMMUNITY): Payer: Self-pay | Admitting: Orthopedic Surgery

## 2018-02-26 ENCOUNTER — Encounter (HOSPITAL_COMMUNITY): Payer: Self-pay | Admitting: Emergency Medicine

## 2018-02-26 ENCOUNTER — Other Ambulatory Visit: Payer: Self-pay

## 2018-02-26 ENCOUNTER — Emergency Department (HOSPITAL_COMMUNITY)
Admission: EM | Admit: 2018-02-26 | Discharge: 2018-02-26 | Disposition: A | Discharge status: Home / Self Care | Payer: Managed Care, Other (non HMO) | Attending: Emergency Medicine | Admitting: Emergency Medicine

## 2018-02-26 DIAGNOSIS — R112 Nausea with vomiting, unspecified: Secondary | ICD-10-CM | POA: Diagnosis not present

## 2018-02-26 DIAGNOSIS — J45909 Unspecified asthma, uncomplicated: Secondary | ICD-10-CM | POA: Diagnosis not present

## 2018-02-26 DIAGNOSIS — R1110 Vomiting, unspecified: Secondary | ICD-10-CM | POA: Diagnosis present

## 2018-02-26 DIAGNOSIS — R7401 Elevation of levels of liver transaminase levels: Secondary | ICD-10-CM

## 2018-02-26 DIAGNOSIS — R740 Nonspecific elevation of levels of transaminase and lactic acid dehydrogenase [LDH]: Secondary | ICD-10-CM | POA: Diagnosis not present

## 2018-02-26 DIAGNOSIS — R197 Diarrhea, unspecified: Secondary | ICD-10-CM | POA: Insufficient documentation

## 2018-02-26 LAB — CBC WITH DIFFERENTIAL/PLATELET
BASOS PCT: 0 %
Basophils Absolute: 0 10*3/uL (ref 0.0–0.1)
Eosinophils Absolute: 0 10*3/uL (ref 0.0–0.7)
Eosinophils Relative: 0 %
HEMATOCRIT: 42.8 % (ref 39.0–52.0)
HEMOGLOBIN: 14.2 g/dL (ref 13.0–17.0)
LYMPHS PCT: 7 %
Lymphs Abs: 0.5 10*3/uL — ABNORMAL LOW (ref 0.7–4.0)
MCH: 31.1 pg (ref 26.0–34.0)
MCHC: 33.2 g/dL (ref 30.0–36.0)
MCV: 93.7 fL (ref 78.0–100.0)
MONO ABS: 0.2 10*3/uL (ref 0.1–1.0)
MONOS PCT: 3 %
NEUTROS ABS: 7.1 10*3/uL (ref 1.7–7.7)
NEUTROS PCT: 90 %
Platelets: 263 10*3/uL (ref 150–400)
RBC: 4.57 MIL/uL (ref 4.22–5.81)
RDW: 14.1 % (ref 11.5–15.5)
WBC: 7.9 10*3/uL (ref 4.0–10.5)

## 2018-02-26 LAB — COMPREHENSIVE METABOLIC PANEL
ALK PHOS: 50 U/L (ref 38–126)
ALT: 67 U/L — ABNORMAL HIGH (ref 17–63)
ANION GAP: 9 (ref 5–15)
AST: 57 U/L — ABNORMAL HIGH (ref 15–41)
Albumin: 4.3 g/dL (ref 3.5–5.0)
BUN: 15 mg/dL (ref 6–20)
CALCIUM: 9.1 mg/dL (ref 8.9–10.3)
CHLORIDE: 104 mmol/L (ref 101–111)
CO2: 26 mmol/L (ref 22–32)
Creatinine, Ser: 1.2 mg/dL (ref 0.61–1.24)
GFR calc Af Amer: >60 mL/min (ref >60)
GFR calc non Af Amer: >60 mL/min (ref >60)
Glucose, Bld: 110 mg/dL — ABNORMAL HIGH (ref 65–99)
POTASSIUM: 4.9 mmol/L (ref 3.5–5.1)
SODIUM: 139 mmol/L (ref 135–145)
Total Bilirubin: 1.4 mg/dL — ABNORMAL HIGH (ref 0.3–1.2)
Total Protein: 7.9 g/dL (ref 6.5–8.1)

## 2018-02-26 LAB — URINALYSIS, ROUTINE W REFLEX MICROSCOPIC
Bilirubin Urine: NEGATIVE
GLUCOSE, UA: NEGATIVE mg/dL
Hgb urine dipstick: NEGATIVE
KETONES UR: NEGATIVE mg/dL
LEUKOCYTES UA: NEGATIVE
NITRITE: NEGATIVE
PH: 6 (ref 5.0–8.0)
Protein, ur: NEGATIVE mg/dL
Specific Gravity, Urine: 1.029 (ref 1.005–1.030)

## 2018-02-26 LAB — LIPASE, BLOOD: LIPASE: 33 U/L (ref 11–51)

## 2018-02-26 MED ORDER — ONDANSETRON HCL 4 MG/2ML IJ SOLN
4.0000 mg | Freq: Once | INTRAMUSCULAR | Status: AC
  Administered 2018-02-26: 4 mg via INTRAVENOUS
  Filled 2018-02-26: qty 2

## 2018-02-26 MED ORDER — SODIUM CHLORIDE 0.9 % IV BOLUS (SEPSIS)
1000.0000 mL | Freq: Once | INTRAVENOUS | Status: AC
  Administered 2018-02-26: 1000 mL via INTRAVENOUS

## 2018-02-26 MED ORDER — ONDANSETRON HCL 4 MG PO TABS: 4.0000 mg | ORAL_TABLET | Freq: Four times a day (QID) | ORAL | 0 refills | Status: AC | PRN

## 2018-02-26 MED ORDER — LOPERAMIDE HCL 2 MG PO CAPS
4.0000 mg | ORAL_CAPSULE | Freq: Once | ORAL | Status: AC
  Administered 2018-02-26: 4 mg via ORAL
  Filled 2018-02-26: qty 2

## 2018-02-26 MED ORDER — ONDANSETRON 4 MG PO TBDP: 4.0000 mg | ORAL_TABLET | Freq: Once | ORAL | Status: DC | PRN

## 2018-02-26 NOTE — ED Triage Notes (Signed)
Pt reports eating chicken last night and then began feeling nauseated and vomiting appx 30 minutes after eating. Pt reports 3 episodes of vomiting and 4 episodes of diarrhea.

## 2018-02-26 NOTE — ED Notes (Signed)
Pt given gingerale for PO challenge 

## 2018-02-26 NOTE — Discharge Instructions (Addendum)
Take loperamide (Imodium AD) as needed for diarrhea.  Some of your liver tests were slightly elevated today - please have them repeated in 2-4 weeks.

## 2018-02-26 NOTE — ED Provider Notes (Signed)
[facility]-EMERGENCY DEPT Provider Note   CSN: [id number] Arrival date & time: 02/26/18  0345     History   Chief Complaint Chief Complaint  Patient presents with  . Emesis    HPI Blake Holland is a 37 y.o. male.  The history is provided by the patient.  He has a history of asthma and comes into the emergency department with vomiting and diarrhea.  Vomiting and diarrhea started about 1 hour after eating [employer] chicken.  However, other people ate the chicken did not get sick.  He states he vomited about 3 times and had 3 episodes of diarrhea.  He denies fever, chills, sweats.  Denies abdominal pain.  He denies any sick contacts.  He has not treated himself at home.  Past Medical History:  Diagnosis Date  . Asthma     There are no active problems to display for this patient.   Past Surgical History:  Procedure Laterality Date  . APPENDECTOMY  08/12/11  . I&D EXTREMITY  05/28/2012   Procedure: IRRIGATION AND DEBRIDEMENT EXTREMITY;  Surgeon: Marlowe ShoresMatthew A Weingold, MD;  Location: [facility] OR;  Service: Orthopedics;  Laterality: Left;  EXPLORE AND REPAIR LEFT INDEX Finger, Open Reduction of left index finger fracture with .028 K-Wire        Home Medications    Prior to Admission medications   Medication Sig Start Date End Date Taking? Authorizing Provider  oxyCODONE-acetaminophen (PERCOCET) 5-325 MG per tablet Take 1 tablet by mouth every 4 (four) hours as needed. For pain    [provider]    Family History Family History  Problem Relation Age of Onset  . Cancer Mother 6269       breast  . Heart disease Father 3581    Social History Social History   Tobacco Use  . Smoking status: Never Smoker  . Smokeless tobacco: Never Used  Substance Use Topics  . Alcohol use: Yes  . Drug use: No     Allergies   Shellfish allergy and Iodine   Review of Systems Review of Systems  All other systems reviewed and are negative.    Physical  Exam Updated Vital Signs BP 109/64 (BP Location: Left Arm)   Pulse 100   Temp 99.6 F (37.6 C) (Oral)   Resp 17   Ht 6\' 4"  (1.93 m)   Wt 111.1 kg (245 lb)   SpO2 96%   BMI 29.82 kg/m   Physical Exam  Nursing note and vitals reviewed.  37 year old male, resting comfortably and in no acute distress. Vital signs are normal. Oxygen saturation is 96%, which is normal. Head is normocephalic and atraumatic. PERRLA, EOMI. Oropharynx is clear. Neck is nontender and supple without adenopathy or JVD. Back is nontender and there is no CVA tenderness. Lungs are clear without rales, wheezes, or rhonchi. Chest is nontender. Heart has regular rate and rhythm without murmur. Abdomen is soft, flat, nontender without masses or hepatosplenomegaly and peristalsis is hypoactive. Extremities have no cyanosis or edema, full range of motion is present. Skin is warm and dry without rash. Neurologic: Mental status is normal, cranial nerves are intact, there are no motor or sensory deficits.  ED Treatments / Results  Labs (all labs ordered are listed, but only abnormal results are displayed) Labs Reviewed  COMPREHENSIVE METABOLIC PANEL - Abnormal; Notable for the following components:      Result Value   Glucose, Bld 110 (*)    AST 57 (*)  ALT 67 (*)    Total Bilirubin 1.4 (*)    All other components within normal limits  CBC WITH DIFFERENTIAL/PLATELET - Abnormal; Notable for the following components:   Lymphs Abs 0.5 (*)    All other components within normal limits  LIPASE, BLOOD  URINALYSIS, ROUTINE W REFLEX MICROSCOPIC   Procedures Procedures (including critical care time)  Medications Ordered in ED Medications  ondansetron (ZOFRAN-ODT) disintegrating tablet 4 mg (has no administration in time range)  ondansetron (ZOFRAN) injection 4 mg (has no administration in time range)  sodium chloride 0.9 % bolus 1,000 mL (has no administration in time range)  loperamide (IMODIUM) capsule 4 mg  (has no administration in time range)     Initial Impression / Assessment and Plan / ED Course  I have reviewed the triage vital signs and the nursing notes.  Pertinent labs & imaging results that were available during my care of the patient were reviewed by me and considered in my medical decision making (see chart for details).  Nausea, vomiting, diarrhea in pattern strongly suggestive of viral gastroenteritis.  Doubt food poisoning since other people ate the same food did not get sick.  He is nontoxic in appearance.  He will be given IV fluids and will check screening labs.  He is given ondansetron for nausea and loperamide for diarrhea.  Old records are reviewed, and he has no relevant past visits.  Laboratory workup is significant for mild elevation of transaminases of uncertain significance.  This will need to be repeated as an outpatient.  He states that nausea and sense of impending diarrhea are improved, but he still is not feeling well.  He was given an oral fluid challenge in did not vomit following that.  He is discharged with prescription for ondansetron and advised to use over-the-counter loperamide as needed for diarrhea.  Advised to repeat his liver function tests in 2-4 weeks.  Final Clinical Impressions(s) / ED Diagnoses   Final diagnoses:  Nausea vomiting and diarrhea  Elevated transaminase level    ED Discharge Orders        Ordered    ondansetron (ZOFRAN) 4 MG tablet  Every 6 hours PRN     02/26/18 0717       Dione Booze, MD 02/26/18 [phone]

## 2020-05-09 ENCOUNTER — Emergency Department (HOSPITAL_BASED_OUTPATIENT_CLINIC_OR_DEPARTMENT_OTHER): Payer: Self-pay

## 2020-05-09 ENCOUNTER — Encounter (HOSPITAL_BASED_OUTPATIENT_CLINIC_OR_DEPARTMENT_OTHER): Payer: Self-pay | Admitting: Emergency Medicine

## 2020-05-09 ENCOUNTER — Other Ambulatory Visit: Payer: Self-pay

## 2020-05-09 ENCOUNTER — Emergency Department (HOSPITAL_BASED_OUTPATIENT_CLINIC_OR_DEPARTMENT_OTHER)
Admission: EM | Admit: 2020-05-09 | Discharge: 2020-05-09 | Disposition: A | Discharge status: Home / Self Care | Payer: Self-pay | Attending: Emergency Medicine | Admitting: Emergency Medicine

## 2020-05-09 DIAGNOSIS — R0989 Other specified symptoms and signs involving the circulatory and respiratory systems: Secondary | ICD-10-CM | POA: Insufficient documentation

## 2020-05-09 DIAGNOSIS — J069 Acute upper respiratory infection, unspecified: Secondary | ICD-10-CM | POA: Insufficient documentation

## 2020-05-09 DIAGNOSIS — Z20822 Contact with and (suspected) exposure to covid-19: Secondary | ICD-10-CM | POA: Insufficient documentation

## 2020-05-09 DIAGNOSIS — R0981 Nasal congestion: Secondary | ICD-10-CM | POA: Insufficient documentation

## 2020-05-09 DIAGNOSIS — J45909 Unspecified asthma, uncomplicated: Secondary | ICD-10-CM | POA: Insufficient documentation

## 2020-05-09 DIAGNOSIS — J029 Acute pharyngitis, unspecified: Secondary | ICD-10-CM | POA: Insufficient documentation

## 2020-05-09 LAB — SARS CORONAVIRUS 2 BY RT PCR (HOSPITAL ORDER, PERFORMED IN ~~LOC~~ HOSPITAL LAB): SARS Coronavirus 2: NEGATIVE

## 2020-05-09 NOTE — Discharge Instructions (Signed)
Recommend Tylenol and Motrin as needed for pain.  Can continue to use cough syrup as needed and over-the-counter decongestants.  If you develop difficulty in breathing, or other new concerning symptom, return to ER for reassessment.

## 2020-05-09 NOTE — ED Provider Notes (Addendum)
[facility] EMERGENCY DEPARTMENT Provider Note   CSN: [id number] Arrival date & time: 05/09/20  0740     History Chief Complaint  Patient presents with  . Cough  . Nasal Congestion  . Chest congestion  . Sore Throat    Blake Holland is a 39 y.o. male.  Presents to ER concern for cough, congestion.  Symptoms since Wednesday, initially having mild cough, congestion and chest congestion no nasal congestion as well as sore throat.  No fever.  Small clear sputum production.  Has had 1 moderna shot.  No known Covid exposures.  Has been trying OTC meds, Robitussin with minimal relief.  Sore throat relatively mild, worse with swallowing, has been tolerating p.o., no difficulty with swallowing, no neck swelling or neck stiffness. HPI     Past Medical History:  Diagnosis Date  . Asthma     There are no problems to display for this patient.   Past Surgical History:  Procedure Laterality Date  . APPENDECTOMY  08/12/11  . I & D EXTREMITY  05/28/2012   Procedure: IRRIGATION AND DEBRIDEMENT EXTREMITY;  Surgeon: Marlowe Shores, MD;  Location: [facility] OR;  Service: Orthopedics;  Laterality: Left;  EXPLORE AND REPAIR LEFT INDEX Finger, Open Reduction of left index finger fracture with .028 K-Wire       Family History  Problem Relation Age of Onset  . Cancer Mother 35       breast  . Heart disease Father 53    Social History   Tobacco Use  . Smoking status: Never Smoker  . Smokeless tobacco: Never Used  Substance Use Topics  . Alcohol use: Yes  . Drug use: No    Home Medications Prior to Admission medications   Medication Sig Start Date End Date Taking? Authorizing Provider  ondansetron (ZOFRAN) 4 MG tablet Take 1 tablet (4 mg total) by mouth every 6 (six) hours as needed for nausea. 02/26/18   Dione Booze, MD    Allergies    Shellfish allergy and Iodine  Review of Systems   Review of Systems  Constitutional: Negative for chills and fever.  HENT: Negative for  ear pain and sore throat.   Eyes: Negative for pain and visual disturbance.  Respiratory: Negative for cough and shortness of breath.   Cardiovascular: Negative for chest pain and palpitations.  Gastrointestinal: Negative for abdominal pain and vomiting.  Genitourinary: Negative for dysuria and hematuria.  Musculoskeletal: Negative for arthralgias and back pain.  Skin: Negative for color change and rash.  Neurological: Negative for seizures and syncope.  All other systems reviewed and are negative.   Physical Exam Updated Vital Signs BP 129/83 (BP Location: Right Arm)   Pulse (!) 106   Temp 98.8 F (37.1 C) (Oral)   Resp 14   Ht 6\' 4"  (1.93 m)   Wt 111.1 kg   SpO2 98%   BMI 29.82 kg/m   Physical Exam Vitals and nursing note reviewed.  Constitutional:      Appearance: He is well-developed.  HENT:     Head: Normocephalic and atraumatic.  Eyes:     Conjunctiva/sclera: Conjunctivae normal.  Cardiovascular:     Rate and Rhythm: Normal rate and regular rhythm.     Heart sounds: No murmur heard.   Pulmonary:     Effort: Pulmonary effort is normal. No respiratory distress.     Breath sounds: Normal breath sounds.  Abdominal:     Palpations: Abdomen is soft.     Tenderness: There  is no abdominal tenderness.  Musculoskeletal:     Cervical back: Neck supple.  Skin:    General: Skin is warm and dry.     Capillary Refill: Capillary refill takes less than 2 seconds.  Neurological:     Mental Status: He is alert.     ED Results / Procedures / Treatments   Labs (all labs ordered are listed, but only abnormal results are displayed) Labs Reviewed  SARS CORONAVIRUS 2 BY RT PCR (HOSPITAL ORDER, [facility])    EKG None  Radiology DG Chest Portable 1 View  Result Date: 05/09/2020 CLINICAL DATA:  Cough.  Concern for pneumonia. EXAM: PORTABLE CHEST 1 VIEW COMPARISON:  05/15/2009 FINDINGS: The heart size and mediastinal contours are within normal  limits. Both lungs are clear. The visualized skeletal structures are unremarkable. IMPRESSION: No active disease. Electronically Signed   By: Nolon Nations M.D.   On: 05/09/2020 08:39    Procedures Procedures (including critical care time)  Medications Ordered in ED Medications - No data to display  ED Course  I have reviewed the triage vital signs and the nursing notes.  Pertinent labs & imaging results that were available during my care of the patient were reviewed by me and considered in my medical decision making (see chart for details).  Clinical Course as of May 09 849  Sat May 09, 2020  0806 [phone]   [RD]    Clinical Course User Index [RD] Lucrezia Starch, MD   MDM Rules/Calculators/A&P                      39 year old male presented to ER with cough, congestion.  On exam, patient noted to have normal vital signs, remarkably well-appearing.  His lungs were clear, given symptoms ongoing for few days, check CXR which was negative for pneumonia.  Clear posterior oropharynx, doubt strep pharyngitis.  Given associated cough, most likely upper respiratory viral infection.  Recommended symptomatic control for now.  Send Covid though lower suspicion.  Reviewed return precautions, discharged home.    After the discussed management above, the patient was determined to be safe for discharge.  The patient was in agreement with this plan and all questions regarding their care were answered.  ED return precautions were discussed and the patient will return to the ED with any significant worsening of condition.    Final Clinical Impression(s) / ED Diagnoses Final diagnoses:  Viral upper respiratory tract infection    Rx / DC Orders ED Discharge Orders    None       Lucrezia Starch, MD 05/09/20 6789    Lucrezia Starch, MD 06/06/20 [id number]

## 2020-05-09 NOTE — ED Triage Notes (Addendum)
Pt here with nasal and chest congestion, sore throat, cough since Wednesday. Has taken the first Moderna shot for COVID. No relief with OTC meds.

## 2021-10-13 IMAGING — DX DG CHEST 1V PORT
1 series · 1 of 1 positions shown · non-contrast
Comparison: 05/15/2009

CLINICAL DATA: Cough.  Concern for pneumonia.

EXAM:
PORTABLE CHEST 1 VIEW

[chest ap]
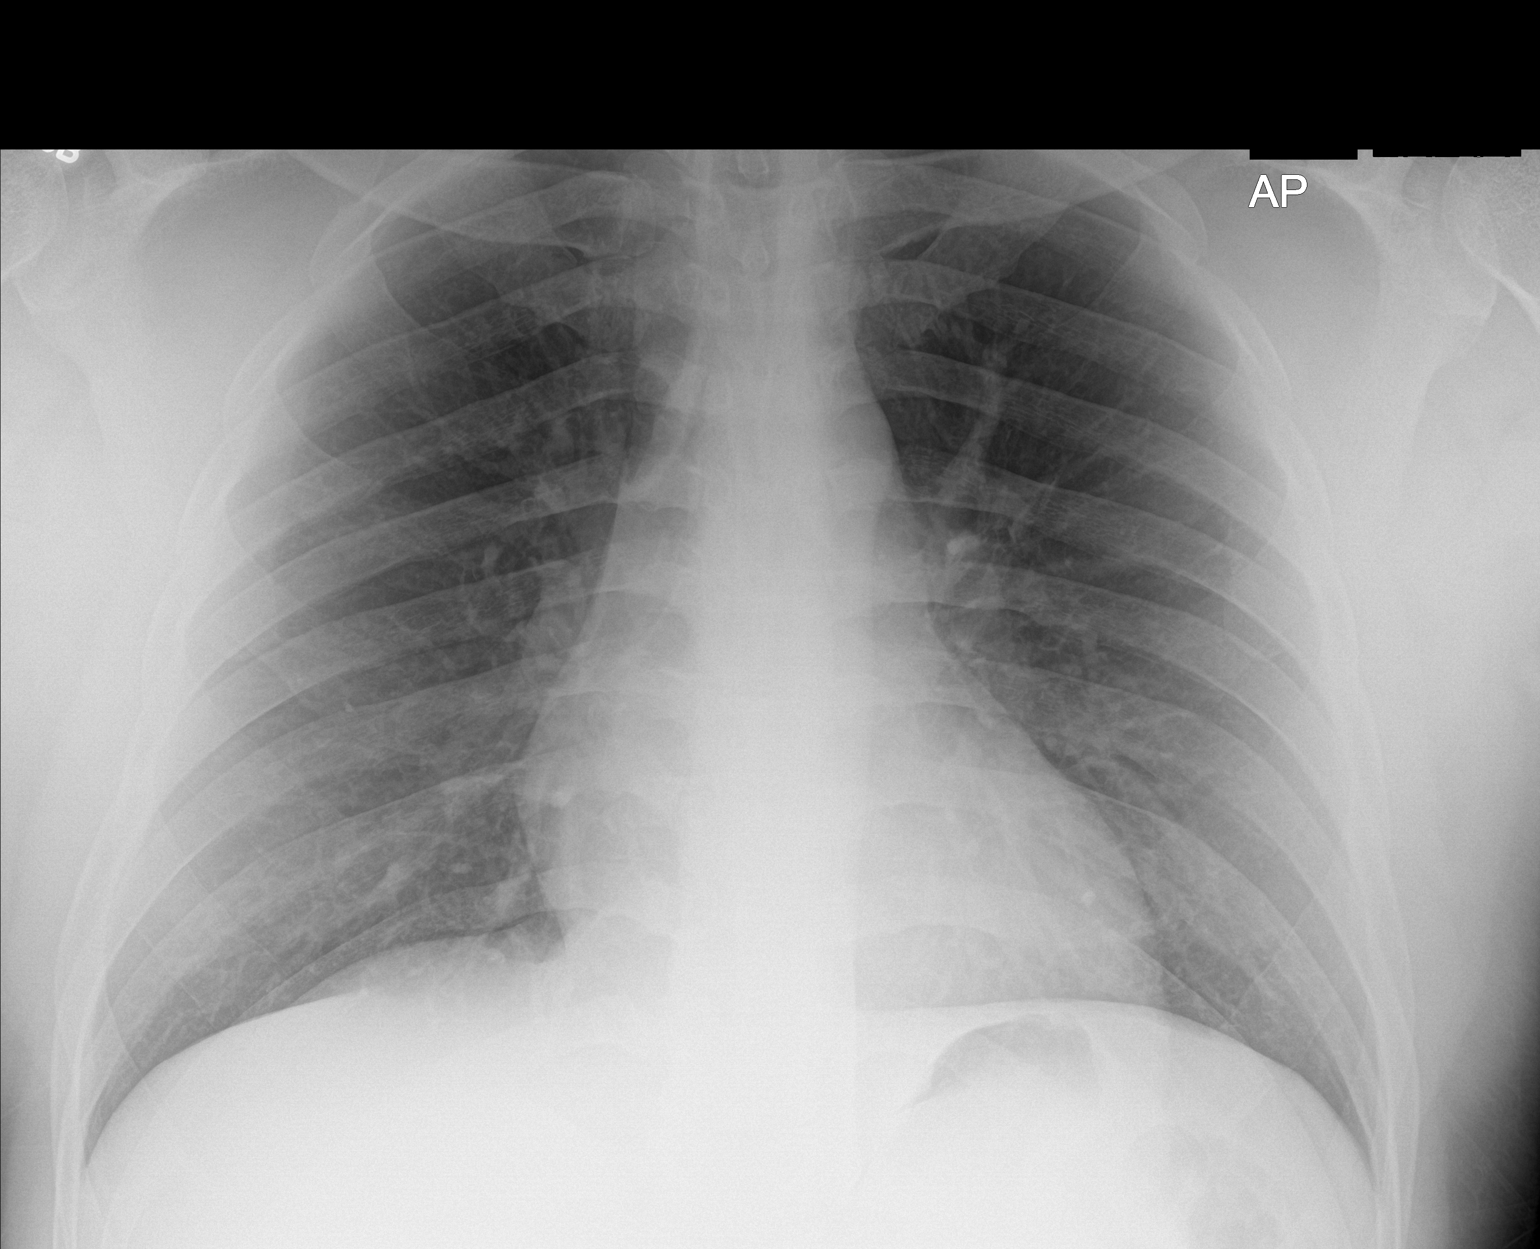

[1 of 1 positions shown; findings below may reference images not displayed]

FINDINGS: The heart size and mediastinal contours are within normal limits.
Both lungs are clear. The visualized skeletal structures are
unremarkable.
IMPRESSION: No active disease.

## 2021-11-05 ENCOUNTER — Encounter (HOSPITAL_BASED_OUTPATIENT_CLINIC_OR_DEPARTMENT_OTHER): Payer: Self-pay

## 2021-11-05 ENCOUNTER — Other Ambulatory Visit: Payer: Self-pay

## 2021-11-05 ENCOUNTER — Emergency Department (HOSPITAL_BASED_OUTPATIENT_CLINIC_OR_DEPARTMENT_OTHER)
Admission: EM | Admit: 2021-11-05 | Discharge: 2021-11-05 | Disposition: A | Discharge status: Home / Self Care | Payer: BLUE CROSS/BLUE SHIELD | Attending: Emergency Medicine | Admitting: Emergency Medicine

## 2021-11-05 DIAGNOSIS — J45909 Unspecified asthma, uncomplicated: Secondary | ICD-10-CM | POA: Insufficient documentation

## 2021-11-05 DIAGNOSIS — Z20822 Contact with and (suspected) exposure to covid-19: Secondary | ICD-10-CM | POA: Insufficient documentation

## 2021-11-05 DIAGNOSIS — R059 Cough, unspecified: Secondary | ICD-10-CM | POA: Diagnosis present

## 2021-11-05 DIAGNOSIS — J069 Acute upper respiratory infection, unspecified: Secondary | ICD-10-CM | POA: Insufficient documentation

## 2021-11-05 LAB — RESP PANEL BY RT-PCR (FLU A&B, COVID) ARPGX2
Influenza A by PCR: NEGATIVE
Influenza B by PCR: NEGATIVE
SARS Coronavirus 2 by RT PCR: NEGATIVE

## 2021-11-05 MED ORDER — BENZONATATE 100 MG PO CAPS
100.0000 mg | ORAL_CAPSULE | Freq: Once | ORAL | Status: AC
  Administered 2021-11-05: 100 mg via ORAL
  Filled 2021-11-05: qty 1

## 2021-11-05 MED ORDER — BENZONATATE 100 MG PO CAPS: 100.0000 mg | ORAL_CAPSULE | Freq: Three times a day (TID) | ORAL | 0 refills | Status: DC | PRN

## 2021-11-05 MED ORDER — ALBUTEROL SULFATE HFA 108 (90 BASE) MCG/ACT IN AERS
2.0000 | INHALATION_SPRAY | Freq: Once | RESPIRATORY_TRACT | Status: AC
  Administered 2021-11-05: 2 via RESPIRATORY_TRACT
  Filled 2021-11-05: qty 6.7

## 2021-11-05 NOTE — ED Provider Notes (Signed)
[facility] EMERGENCY DEPARTMENT Provider Note  CSN: [id number] Arrival date & time: 11/05/21 3254  Chief Complaint(s) Cough  HPI Blake Holland is a 40 y.o. male    Cough Cough characteristics:  Hacking Severity:  Moderate Onset quality:  Gradual Duration:  3 days Timing:  Constant Progression:  Waxing and waning Chronicity:  New Relieved by: theraflu. Worsened by:  Deep breathing Associated symptoms: chest pain (only with coughing), chills (resolved with theraflu), myalgias and sore throat   Associated symptoms: no fever and no headaches    Past Medical History Past Medical History:  Diagnosis Date   Asthma    There are no problems to display for this patient.  Home Medication(s) Prior to Admission medications   Medication Sig Start Date End Date Taking? Authorizing Provider  ondansetron (ZOFRAN) 4 MG tablet Take 1 tablet (4 mg total) by mouth every 6 (six) hours as needed for nausea. 02/26/18   Dione Booze, MD                                                                                                                                    Past Surgical History Past Surgical History:  Procedure Laterality Date   APPENDECTOMY  08/12/11   I & D EXTREMITY  05/28/2012   Procedure: IRRIGATION AND DEBRIDEMENT EXTREMITY;  Surgeon: Marlowe Shores, MD;  Location: [facility] OR;  Service: Orthopedics;  Laterality: Left;  EXPLORE AND REPAIR LEFT INDEX Finger, Open Reduction of left index finger fracture with .028 K-Wire   Family History Family History  Problem Relation Age of Onset   Cancer Mother 31       breast   Heart disease Father 88    Social History Social History   Tobacco Use   Smoking status: Never   Smokeless tobacco: Never  Substance Use Topics   Alcohol use: Yes    Alcohol/week: 3.0 standard drinks    Types: 3 Standard drinks or equivalent per week   Drug use: Yes    Comment: houka   Allergies Shellfish allergy and Iodine  Review of  Systems Review of Systems  Constitutional:  Positive for chills (resolved with theraflu). Negative for fever.  HENT:  Positive for sore throat.   Respiratory:  Positive for cough.   Cardiovascular:  Positive for chest pain (only with coughing).  Musculoskeletal:  Positive for myalgias.  Neurological:  Negative for headaches.  All other systems are reviewed and are negative for acute change except as noted in the HPI  Physical Exam Vital Signs  I have reviewed the triage vital signs BP (!) 138/98 (BP Location: Right Arm)   Pulse 79   Temp 97.6 F (36.4 C) (Oral)   Resp 18   Ht 6\' 4"  (1.93 m)   Wt 113.4 kg   SpO2 100%   BMI 30.43 kg/m   Physical Exam Vitals reviewed.  Constitutional:      General: He  is not in acute distress.    Appearance: He is well-developed. He is not diaphoretic.  HENT:     Head: Normocephalic and atraumatic.     Nose: Mucosal edema and congestion present.     Mouth/Throat:     Lips: No lesions.     Mouth: No oral lesions.     Tongue: No lesions.     Palate: No lesions.     Pharynx: Posterior oropharyngeal erythema (mild) present. No pharyngeal swelling or oropharyngeal exudate.     Tonsils: No tonsillar exudate or tonsillar abscesses.     Comments: Post nasal drip  Eyes:     General: No scleral icterus.       Right eye: No discharge.        Left eye: No discharge.     Conjunctiva/sclera: Conjunctivae normal.     Pupils: Pupils are equal, round, and reactive to light.  Cardiovascular:     Rate and Rhythm: Normal rate and regular rhythm.     Heart sounds: No murmur heard.   No friction rub. No gallop.  Pulmonary:     Effort: Pulmonary effort is normal. No respiratory distress.     Breath sounds: Normal breath sounds. No stridor. No rales.  Abdominal:     General: There is no distension.     Palpations: Abdomen is soft.     Tenderness: There is no abdominal tenderness.  Musculoskeletal:        General: No tenderness.     Cervical back:  Normal range of motion and neck supple.  Skin:    General: Skin is warm and dry.     Findings: No erythema or rash.  Neurological:     Mental Status: He is alert and oriented to person, place, and time.    ED Results and Treatments Labs (all labs ordered are listed, but only abnormal results are displayed) Labs Reviewed  RESP PANEL BY RT-PCR (FLU A&B, COVID) ARPGX2                                                                                                                         EKG  EKG Interpretation  Date/Time:    Ventricular Rate:    PR Interval:    QRS Duration:   QT Interval:    QTC Calculation:   R Axis:     Text Interpretation:         Radiology No results found.  Pertinent labs & imaging results that were available during my care of the patient were reviewed by me and considered in my medical decision making (see MDM for details).  Medications Ordered in ED Medications  benzonatate (TESSALON) capsule 100 mg (has no administration in time range)  albuterol (VENTOLIN HFA) 108 (90 Base) MCG/ACT inhaler 2 puff (has no administration in time range)  Procedures Procedures  (including critical care time)  Medical Decision Making / ED Course I have reviewed the nursing notes for this encounter and the patient's prior records (if available in EHR or on provided paperwork).  Blake Holland was evaluated in Emergency Department on 11/05/2021 for the symptoms described in the history of present illness. He was evaluated in the context of the global COVID-19 pandemic, which necessitated consideration that the patient might be at risk for infection with the SARS-CoV-2 virus that causes COVID-19. Institutional protocols and algorithms that pertain to the evaluation of patients at risk for COVID-19 are in a state of rapid change based on  information released by regulatory bodies including the CDC and federal and state organizations. These policies and algorithms were followed during the patient's care in the ED.     Patient presents with viral symptoms for 3 days. adequate oral hydration. Rest of history as above.  Patient appears well. No signs of toxicity, patient is interactive. No hypoxia, tachypnea or other signs of respiratory distress. No sign of clinical dehydration. Lung exam clear. Rest of exam as above.  Most consistent with viral illness   No evidence suggestive of pharyngitis, AOM, PNA.  Chest x-ray not indicated at this time.  Discussed symptomatic treatment with the patient and they will follow closely with their PCP.     Final Clinical Impression(s) / ED Diagnoses Final diagnoses:  Viral URI with cough   The patient appears reasonably screened and/or stabilized for discharge and I doubt any other medical condition or other [facility] requiring further screening, evaluation, or treatment in the ED at this time prior to discharge. Safe for discharge with strict return precautions.  Disposition: Discharge  Condition: Good  I have discussed the results, Dx and Tx plan with the patient/family who expressed understanding and agree(s) with the plan. Discharge instructions discussed at length. The patient/family was given strict return precautions who verbalized understanding of the instructions. No further questions at time of discharge.    ED Discharge Orders     None       Follow Up: Primary care provider  Call  to schedule an appointment for close follow up     This chart was dictated using voice recognition software.  Despite best efforts to proofread,  errors can occur which can change the documentation meaning.    Nira Conn, MD 11/05/21 0630

## 2021-11-05 NOTE — ED Triage Notes (Signed)
Cough and chest congestion that  started Saturday and is persistent, now hurts to cough

## 2022-01-25 ENCOUNTER — Other Ambulatory Visit: Payer: Self-pay

## 2022-01-25 ENCOUNTER — Encounter (HOSPITAL_BASED_OUTPATIENT_CLINIC_OR_DEPARTMENT_OTHER): Payer: Self-pay | Admitting: Emergency Medicine

## 2022-01-25 ENCOUNTER — Emergency Department (HOSPITAL_BASED_OUTPATIENT_CLINIC_OR_DEPARTMENT_OTHER)
Admission: EM | Admit: 2022-01-25 | Discharge: 2022-01-25 | Disposition: A | Discharge status: Home / Self Care | Payer: BLUE CROSS/BLUE SHIELD

## 2022-01-25 DIAGNOSIS — R059 Cough, unspecified: Secondary | ICD-10-CM | POA: Diagnosis present

## 2022-01-25 DIAGNOSIS — J40 Bronchitis, not specified as acute or chronic: Secondary | ICD-10-CM | POA: Insufficient documentation

## 2022-01-25 NOTE — ED Triage Notes (Signed)
Pt states he was seen in December and was diagnosed with a URI   Pt states ever since then he has been having a cough  Pt states he has tried OTC medication without relief  Pt states the coughing wakes him up at night

## 2022-01-26 ENCOUNTER — Emergency Department (HOSPITAL_BASED_OUTPATIENT_CLINIC_OR_DEPARTMENT_OTHER): Payer: BLUE CROSS/BLUE SHIELD

## 2022-01-26 ENCOUNTER — Emergency Department (HOSPITAL_BASED_OUTPATIENT_CLINIC_OR_DEPARTMENT_OTHER)
Admission: EM | Admit: 2022-01-26 | Discharge: 2022-01-26 | Disposition: A | Discharge status: Home / Self Care | Payer: BLUE CROSS/BLUE SHIELD | Attending: Emergency Medicine | Admitting: Emergency Medicine

## 2022-01-26 DIAGNOSIS — J40 Bronchitis, not specified as acute or chronic: Secondary | ICD-10-CM

## 2022-01-26 MED ORDER — ADVAIR HFA 45-21 MCG/ACT IN AERO: 2.0000 | INHALATION_SPRAY | Freq: Two times a day (BID) | RESPIRATORY_TRACT | 12 refills | Status: AC

## 2022-01-26 MED ORDER — GUAIFENESIN-CODEINE 100-10 MG/5ML PO SOLN: 10.0000 mL | Freq: Every evening | ORAL | 0 refills | Status: AC

## 2022-01-26 MED ORDER — PREDNISONE 20 MG PO TABS: 20.0000 mg | ORAL_TABLET | Freq: Every day | ORAL | 0 refills | Status: AC

## 2022-01-26 MED ORDER — GUAIFENESIN-CODEINE 100-10 MG/5ML PO SOLN
10.0000 mL | Freq: Once | ORAL | Status: AC
  Administered 2022-01-26: 10 mL via ORAL
  Filled 2022-01-26: qty 10

## 2022-01-26 MED ORDER — PREDNISONE 20 MG PO TABS
20.0000 mg | ORAL_TABLET | Freq: Once | ORAL | Status: AC
  Administered 2022-01-26: 20 mg via ORAL
  Filled 2022-01-26: qty 1

## 2022-01-26 MED ORDER — GUAIFENESIN-DM 100-10 MG/5ML PO SYRP: 10.0000 mL | ORAL_SOLUTION | ORAL | 0 refills | Status: AC | PRN

## 2022-01-26 NOTE — ED Provider Notes (Signed)
[place] EMERGENCY DEPARTMENT Provider Note   CSN: [id number] Arrival date & time: 01/25/22  2256     History  Chief Complaint  Patient presents with   Cough    Blake Holland is a 41 y.o. male.  Pt is a 41 yo male presenting for cough. Pt admits to recent ED presentation for URI with dc with inhaler and tessalon pearls. States symptoms have resolved except for cough. Admits to frequent coughing worse at night.   The history is provided by the patient. No language interpreter was used.  Cough Associated symptoms: no chest pain, no chills, no ear pain, no fever, no rash, no shortness of breath and no sore throat       Home Medications Prior to Admission medications   Medication Sig Start Date End Date Taking? Authorizing Provider  benzonatate (TESSALON) 100 MG capsule Take 1 capsule (100 mg total) by mouth 3 (three) times daily as needed for cough. 11/05/21   Fatima Blank, MD  ondansetron (ZOFRAN) 4 MG tablet Take 1 tablet (4 mg total) by mouth every 6 (six) hours as needed for nausea. [id number]   Delora Fuel, MD      Allergies    Shellfish allergy and Iodine    Review of Systems   Review of Systems  Constitutional:  Negative for chills and fever.  HENT:  Negative for ear pain and sore throat.   Eyes:  Negative for pain and visual disturbance.  Respiratory:  Positive for cough. Negative for shortness of breath.   Cardiovascular:  Negative for chest pain and palpitations.  Gastrointestinal:  Negative for abdominal pain and vomiting.  Genitourinary:  Negative for dysuria and hematuria.  Musculoskeletal:  Negative for arthralgias and back pain.  Skin:  Negative for color change and rash.  Neurological:  Negative for seizures and syncope.  All other systems reviewed and are negative.  Physical Exam Updated Vital Signs BP 129/75    Pulse 67    Temp 98.1 F (36.7 C) (Oral)    Resp 16    Ht 6\' 4"  (1.93 m)    Wt 120.2 kg    SpO2 98%    BMI 32.26 kg/m   Physical Exam Vitals and nursing note reviewed.  Constitutional:      General: He is not in acute distress.    Appearance: He is well-developed.  HENT:     Head: Normocephalic and atraumatic.  Eyes:     Conjunctiva/sclera: Conjunctivae normal.  Cardiovascular:     Rate and Rhythm: Normal rate and regular rhythm.     Heart sounds: No murmur heard. Pulmonary:     Effort: Pulmonary effort is normal. No respiratory distress.     Breath sounds: Normal breath sounds.  Abdominal:     Palpations: Abdomen is soft.     Tenderness: There is no abdominal tenderness.  Musculoskeletal:        General: No swelling.     Cervical back: Neck supple.  Skin:    General: Skin is warm and dry.     Capillary Refill: Capillary refill takes less than 2 seconds.  Neurological:     Mental Status: He is alert.  Psychiatric:        Mood and Affect: Mood normal.    ED Results / Procedures / Treatments   Labs (all labs ordered are listed, but only abnormal results are displayed) Labs Reviewed - No data to display  EKG None  Radiology DG Chest Portable 1 View  Result Date: 01/26/2022 CLINICAL DATA:  Cough. EXAM: PORTABLE CHEST 1 VIEW COMPARISON:  December 07, 2021 FINDINGS: The heart size and mediastinal contours are within normal limits. Both lungs are clear. The visualized skeletal structures are unremarkable. IMPRESSION: No active disease. Electronically Signed   By: Virgina Norfolk M.D.   On: 01/26/2022 02:40    Procedures Procedures    Medications Ordered in ED Medications - No data to display  ED Course/ Medical Decision Making/ A&P                           Medical Decision Making Amount and/or Complexity of Data Reviewed Radiology: ordered.  Risk OTC drugs. Prescription drug management.   3:19 AM  41 yo male presenting for lingering cough and recent URI infection. Pt is Aox3, no acute distress, afebrile, with stable vitals. Physical exam demonstrates dry cough with  productive sputum. Clear breath sounds. No respiratory distress.   Pt symptoms likely secondary to bronchitis. Patient given cough suppressants in ED and sent to pharmacy.   Patient in no distress and overall condition improved here in the ED. Detailed discussions were had with the patient regarding current findings, and need for close f/u with PCP or on call doctor. The patient has been instructed to return immediately if the symptoms worsen in any way for re-evaluation. Patient verbalized understanding and is in agreement with current care plan. All questions answered prior to discharge.        Final Clinical Impression(s) / ED Diagnoses Final diagnoses:  Bronchitis    Rx / DC Orders ED Discharge Orders     None         Lianne Cure, DO [id number]

## 2022-01-26 NOTE — ED Notes (Signed)
Patient verbalizes understanding of discharge instructions. Opportunity for questioning and answers were provided. Armband removed by staff, pt discharged from ED. Ambulated out to lobby with visitor

## 2022-11-29 ENCOUNTER — Encounter (HOSPITAL_BASED_OUTPATIENT_CLINIC_OR_DEPARTMENT_OTHER): Payer: Self-pay | Admitting: Emergency Medicine

## 2022-11-29 ENCOUNTER — Emergency Department (HOSPITAL_BASED_OUTPATIENT_CLINIC_OR_DEPARTMENT_OTHER)
Admission: EM | Admit: 2022-11-29 | Discharge: 2022-11-29 | Disposition: A | Discharge status: Home / Self Care | Payer: 59 | Attending: Emergency Medicine | Admitting: Emergency Medicine

## 2022-11-29 DIAGNOSIS — J45909 Unspecified asthma, uncomplicated: Secondary | ICD-10-CM | POA: Diagnosis not present

## 2022-11-29 DIAGNOSIS — R059 Cough, unspecified: Secondary | ICD-10-CM | POA: Diagnosis present

## 2022-11-29 DIAGNOSIS — Z20822 Contact with and (suspected) exposure to covid-19: Secondary | ICD-10-CM | POA: Diagnosis not present

## 2022-11-29 DIAGNOSIS — J101 Influenza due to other identified influenza virus with other respiratory manifestations: Secondary | ICD-10-CM | POA: Diagnosis not present

## 2022-11-29 LAB — RESP PANEL BY RT-PCR (RSV, FLU A&B, COVID)  RVPGX2
Influenza A by PCR: NEGATIVE
Influenza B by PCR: POSITIVE — AB
Resp Syncytial Virus by PCR: NEGATIVE
SARS Coronavirus 2 by RT PCR: NEGATIVE

## 2022-11-29 MED ORDER — ACETAMINOPHEN 325 MG PO TABS
650.0000 mg | ORAL_TABLET | Freq: Once | ORAL | Status: AC
  Administered 2022-11-29: 650 mg via ORAL
  Filled 2022-11-29: qty 2

## 2022-11-29 MED ORDER — BENZONATATE 100 MG PO CAPS: 100.0000 mg | ORAL_CAPSULE | Freq: Three times a day (TID) | ORAL | 0 refills | Status: AC

## 2022-11-29 NOTE — ED Triage Notes (Signed)
Pt here from home with c/o body aches fever and cough and congestion

## 2022-11-29 NOTE — ED Provider Notes (Signed)
[facility] EMERGENCY DEPARTMENT Provider Note   CSN: [id number] Arrival date & time: 11/29/22  1451     History  Chief Complaint  Patient presents with   Influenza    Blake Holland is a 41 y.o. male.  HPI 41 year old male with a history of asthma presents with cough, fever, chest congestion, body aches, sore throat and headache.  All of his symptoms started on 12/22.  Seems to be progressively worsening.  No shortness of breath.  He has been taking Advil as well as multiple over-the-counter cough medicines.  Home Medications Prior to Admission medications   Medication Sig Start Date End Date Taking? Authorizing Provider  benzonatate (TESSALON) 100 MG capsule Take 1 capsule (100 mg total) by mouth every 8 (eight) hours. 11/29/22  Yes Pricilla Loveless, MD  fluticasone-salmeterol (ADVAIR HFA) [phone] MCG/ACT inhaler Inhale 2 puffs into the lungs 2 (two) times daily. 01/26/22   Edwin Dada P, DO  guaiFENesin-codeine 100-10 MG/5ML syrup Take 10 mLs by mouth at bedtime. 01/26/22   Edwin Dada P, DO  guaiFENesin-dextromethorphan (ROBITUSSIN DM) 100-10 MG/5ML syrup Take 10 mLs by mouth every 4 (four) hours as needed for cough. 01/26/22   Edwin Dada P, DO  ondansetron (ZOFRAN) 4 MG tablet Take 1 tablet (4 mg total) by mouth every 6 (six) hours as needed for nausea. 02/26/18   Dione Booze, MD      Allergies    Shellfish allergy and Iodine    Review of Systems   Review of Systems  Constitutional:  Positive for fever.  HENT:  Positive for sore throat.   Respiratory:  Positive for cough. Negative for shortness of breath.   Musculoskeletal:  Positive for myalgias.  Neurological:  Positive for headaches.    Physical Exam Updated Vital Signs BP 137/88 (BP Location: Left Arm)   Pulse 79   Temp 100.1 F (37.8 C) (Oral)   Resp 18   SpO2 100%  Physical Exam Vitals and nursing note reviewed.  Constitutional:      Appearance: He is well-developed.  HENT:     Head:  Normocephalic and atraumatic.     Mouth/Throat:     Pharynx: Oropharynx is clear. No oropharyngeal exudate.  Cardiovascular:     Rate and Rhythm: Normal rate and regular rhythm.     Heart sounds: Normal heart sounds.  Pulmonary:     Effort: Pulmonary effort is normal.     Breath sounds: Normal breath sounds. No wheezing, rhonchi or rales.  Abdominal:     General: There is no distension.  Skin:    General: Skin is warm and dry.  Neurological:     Mental Status: He is alert.     ED Results / Procedures / Treatments   Labs (all labs ordered are listed, but only abnormal results are displayed) Labs Reviewed  RESP PANEL BY RT-PCR (RSV, FLU A&B, COVID)  RVPGX2 - Abnormal; Notable for the following components:      Result Value   Influenza B by PCR POSITIVE (*)    All other components within normal limits    EKG None  Radiology No results found.  Procedures Procedures    Medications Ordered in ED Medications  acetaminophen (TYLENOL) tablet 650 mg (650 mg Oral Given 11/29/22 1526)    ED Course/ Medical Decision Making/ A&P                           Medical Decision Making Risk OTC  drugs. Prescription drug management.   Patient's flu test is positive.  This correlates with his symptoms.  He is well outside the Tamiflu window despite his comorbidities.  He is also not hypoxic, not having an asthma exacerbation, and is well-appearing.  He was pretty tachycardic but I think that was related to the fever as now his tachycardia has resolved with Tylenol only.  Encourage fluids as well as Tylenol/ibuprofen.  Will prescribe Tessalon Perles in addition to over-the-counter cough medicine.  I do not think x-ray is warranted.  I doubt bacterial superinfection.  Appears stable for discharge home with return precautions.        Final Clinical Impression(s) / ED Diagnoses Final diagnoses:  Influenza B    Rx / DC Orders ED Discharge Orders          Ordered    benzonatate  (TESSALON) 100 MG capsule  Every 8 hours        11/29/22 1759              Pricilla Loveless, MD 11/29/22 1807

## 2022-11-29 NOTE — Discharge Instructions (Addendum)
Your flu test is positive.  Continue to use ibuprofen, Tylenol, and drink plenty of fluids.  Use over-the-counter cough meds we have also prescribed you a prescription medicine.  If you develop trouble breathing, coughing up blood, vomiting, or any other new/concerning symptoms then return to the ER for evaluation.

## 2023-07-02 IMAGING — DX DG CHEST 1V PORT
1 series · 1 of 1 positions shown · non-contrast
Comparison: December 07, 2021

CLINICAL DATA: Cough.

EXAM:
PORTABLE CHEST 1 VIEW

[chest ap]
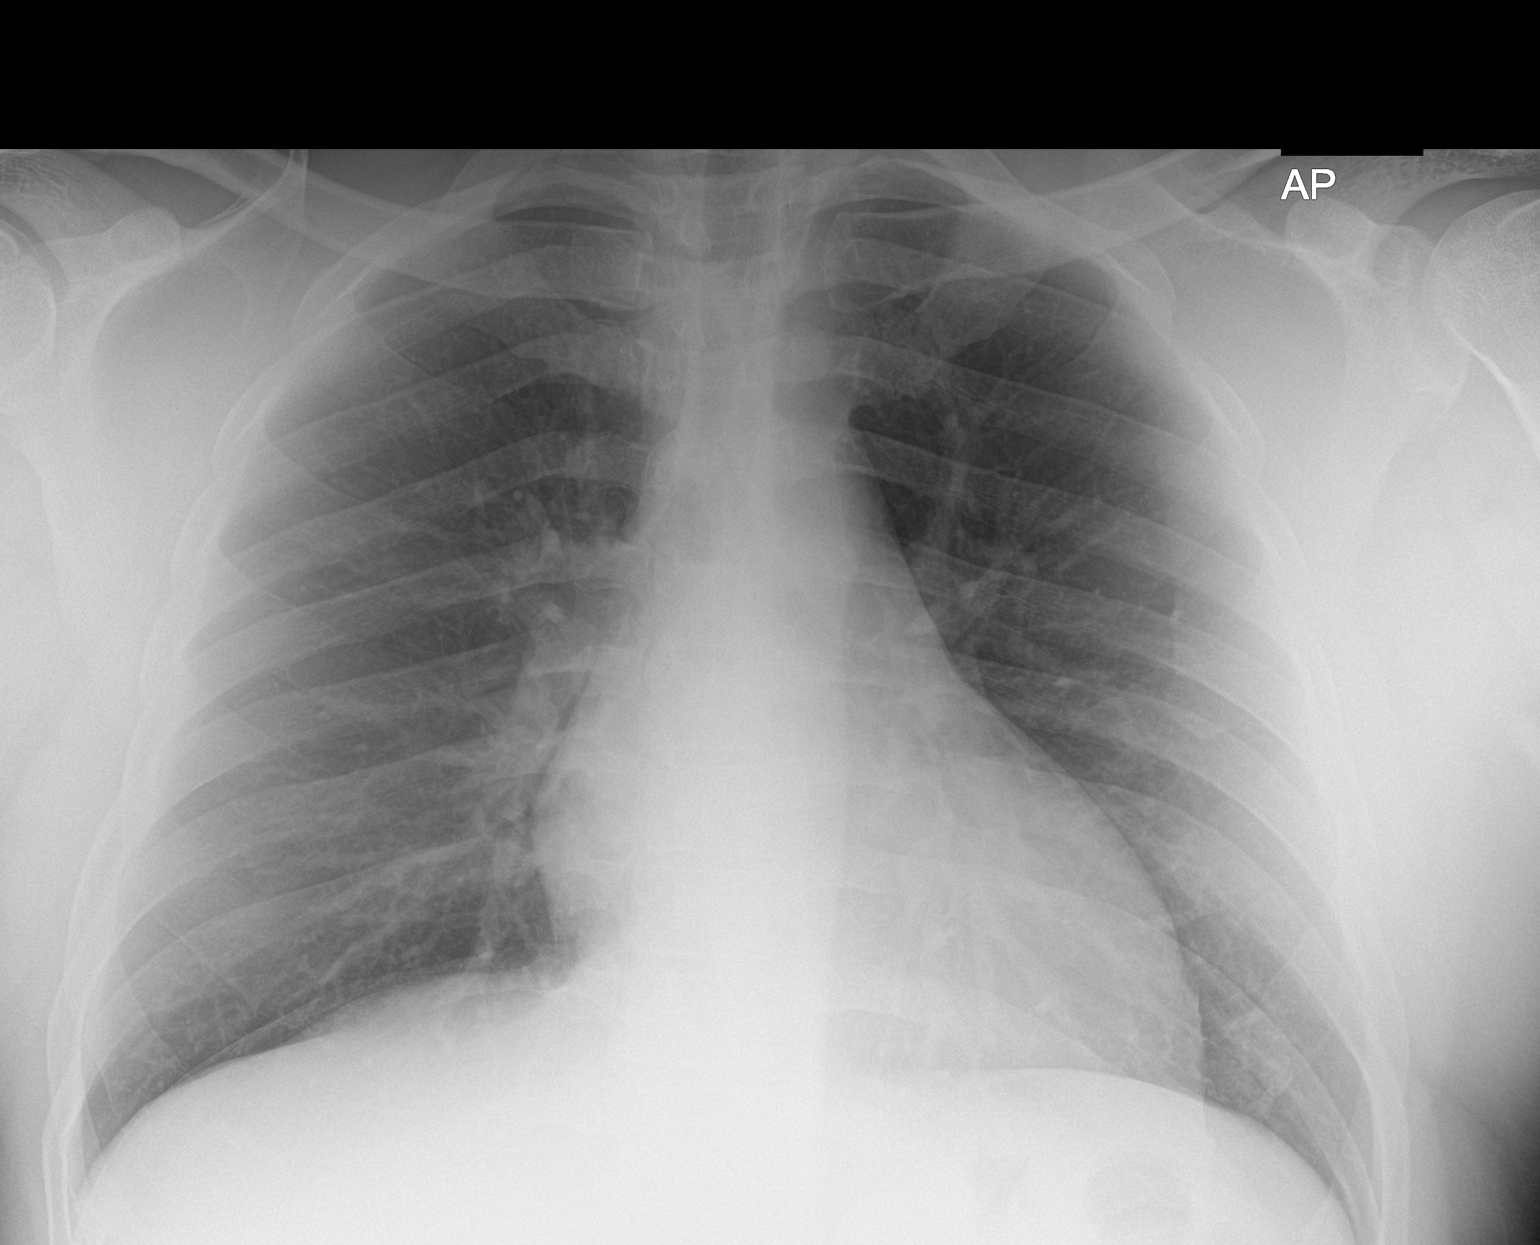

[1 of 1 positions shown; findings below may reference images not displayed]

FINDINGS: The heart size and mediastinal contours are within normal limits.
Both lungs are clear. The visualized skeletal structures are
unremarkable.
IMPRESSION: No active disease.
